# Patient Record
Sex: Male | Born: 1965 | Race: Black or African American | Hispanic: No | Smoking: Smoker, current status unknown
Health system: Southern US, Community
[De-identification: ages and names within clinical notes are randomized; demographics above are authoritative.]

## PROBLEM LIST (undated history)

## (undated) DIAGNOSIS — K635 Polyp of colon: Secondary | ICD-10-CM

## (undated) DIAGNOSIS — E119 Type 2 diabetes mellitus without complications: Secondary | ICD-10-CM

## (undated) DIAGNOSIS — R519 Headache, unspecified: Secondary | ICD-10-CM

## (undated) DIAGNOSIS — N390 Urinary tract infection, site not specified: Secondary | ICD-10-CM

## (undated) HISTORY — PX: WISDOM TOOTH EXTRACTION: SHX21

## (undated) HISTORY — DX: Polyp of colon: K63.5

## (undated) HISTORY — PX: PROSTATE SURGERY: SHX751

## (undated) HISTORY — DX: Type 2 diabetes mellitus without complications: E11.9

## (undated) HISTORY — DX: Urinary tract infection, site not specified: N39.0

## (undated) HISTORY — DX: Headache, unspecified: R51.9

---

## 2015-10-27 LAB — HM COLONOSCOPY

## 2020-11-01 ENCOUNTER — Ambulatory Visit: Payer: BC Managed Care – PPO | Admitting: Internal Medicine

## 2020-11-01 ENCOUNTER — Encounter (INDEPENDENT_AMBULATORY_CARE_PROVIDER_SITE_OTHER): Payer: Self-pay

## 2020-11-01 ENCOUNTER — Other Ambulatory Visit: Payer: Self-pay

## 2020-11-01 ENCOUNTER — Encounter: Payer: Self-pay | Admitting: Internal Medicine

## 2020-11-01 VITALS — BP 126/82 | HR 86 | Temp 98.4°F | Ht 67.0 in | Wt 161.0 lb

## 2020-11-01 DIAGNOSIS — R3916 Straining to void: Secondary | ICD-10-CM | POA: Diagnosis not present

## 2020-11-01 DIAGNOSIS — Z23 Encounter for immunization: Secondary | ICD-10-CM

## 2020-11-01 DIAGNOSIS — Z0001 Encounter for general adult medical examination with abnormal findings: Secondary | ICD-10-CM | POA: Diagnosis not present

## 2020-11-01 DIAGNOSIS — M62542 Muscle wasting and atrophy, not elsewhere classified, left hand: Secondary | ICD-10-CM | POA: Diagnosis not present

## 2020-11-01 DIAGNOSIS — R3121 Asymptomatic microscopic hematuria: Secondary | ICD-10-CM

## 2020-11-01 DIAGNOSIS — N401 Enlarged prostate with lower urinary tract symptoms: Secondary | ICD-10-CM

## 2020-11-01 DIAGNOSIS — E785 Hyperlipidemia, unspecified: Secondary | ICD-10-CM | POA: Diagnosis not present

## 2020-11-01 DIAGNOSIS — Z720 Tobacco use: Secondary | ICD-10-CM | POA: Insufficient documentation

## 2020-11-01 DIAGNOSIS — Z125 Encounter for screening for malignant neoplasm of prostate: Secondary | ICD-10-CM | POA: Diagnosis not present

## 2020-11-01 DIAGNOSIS — K635 Polyp of colon: Secondary | ICD-10-CM | POA: Insufficient documentation

## 2020-11-01 DIAGNOSIS — R972 Elevated prostate specific antigen [PSA]: Secondary | ICD-10-CM

## 2020-11-01 DIAGNOSIS — R7303 Prediabetes: Secondary | ICD-10-CM | POA: Diagnosis not present

## 2020-11-01 LAB — URINALYSIS, ROUTINE W REFLEX MICROSCOPIC
Bilirubin Urine: NEGATIVE
Ketones, ur: NEGATIVE
Leukocytes,Ua: NEGATIVE
Nitrite: NEGATIVE
Specific Gravity, Urine: >=1.03 — AB (ref 1.000–1.030)
Total Protein, Urine: NEGATIVE
Urine Glucose: NEGATIVE
Urobilinogen, UA: 0.2 (ref 0.0–1.0)
pH: 5.5 (ref 5.0–8.0)

## 2020-11-01 LAB — CBC WITH DIFFERENTIAL/PLATELET
Basophils Absolute: 0.1 10*3/uL (ref 0.0–0.1)
Basophils Relative: 1 % (ref 0.0–3.0)
Eosinophils Absolute: 0.1 10*3/uL (ref 0.0–0.7)
Eosinophils Relative: 0.6 % (ref 0.0–5.0)
HCT: 46.3 % (ref 39.0–52.0)
Hemoglobin: 16.5 g/dL (ref 13.0–17.0)
Lymphocytes Relative: 18.8 % (ref 12.0–46.0)
Lymphs Abs: 1.6 10*3/uL (ref 0.7–4.0)
MCHC: 35.6 g/dL (ref 30.0–36.0)
MCV: 84.3 fl (ref 78.0–100.0)
Monocytes Absolute: 0.4 10*3/uL (ref 0.1–1.0)
Monocytes Relative: 5 % (ref 3.0–12.0)
Neutro Abs: 6.5 10*3/uL (ref 1.4–7.7)
Neutrophils Relative %: 74.6 % (ref 43.0–77.0)
Platelets: 280 10*3/uL (ref 150.0–400.0)
RBC: 5.5 Mil/uL (ref 4.22–5.81)
RDW: 14.2 % (ref 11.5–15.5)
WBC: 8.8 10*3/uL (ref 4.0–10.5)

## 2020-11-01 LAB — HEPATIC FUNCTION PANEL
ALT: 22 U/L (ref 0–53)
AST: 23 U/L (ref 0–37)
Albumin: 4.3 g/dL (ref 3.5–5.2)
Alkaline Phosphatase: 71 U/L (ref 39–117)
Bilirubin, Direct: 0.1 mg/dL (ref 0.0–0.3)
Total Bilirubin: 0.4 mg/dL (ref 0.2–1.2)
Total Protein: 6.8 g/dL (ref 6.0–8.3)

## 2020-11-01 LAB — POCT GLYCOSYLATED HEMOGLOBIN (HGB A1C): Hemoglobin A1C: 5.9 % — AB (ref 4.0–5.6)

## 2020-11-01 LAB — BASIC METABOLIC PANEL
BUN: 15 mg/dL (ref 6–23)
CO2: 26 mEq/L (ref 19–32)
Calcium: 9.7 mg/dL (ref 8.4–10.5)
Chloride: 107 mEq/L (ref 96–112)
Creatinine, Ser: 0.89 mg/dL (ref 0.40–1.50)
GFR: 95.68 mL/min (ref >60.00)
Glucose, Bld: 100 mg/dL — ABNORMAL HIGH (ref 70–99)
Potassium: 4.7 mEq/L (ref 3.5–5.1)
Sodium: 139 mEq/L (ref 135–145)

## 2020-11-01 LAB — LIPID PANEL
Cholesterol: 150 mg/dL (ref 0–200)
HDL: 48.4 mg/dL (ref >39.00)
LDL Cholesterol: 91 mg/dL (ref 0–99)
NonHDL: 101.22
Total CHOL/HDL Ratio: 3
Triglycerides: 53 mg/dL (ref 0.0–149.0)
VLDL: 10.6 mg/dL (ref 0.0–40.0)

## 2020-11-01 LAB — PSA: PSA: 9.92 ng/mL — ABNORMAL HIGH (ref 0.10–4.00)

## 2020-11-01 LAB — TSH: TSH: 1.75 u[IU]/mL (ref 0.35–4.50)

## 2020-11-01 NOTE — Progress Notes (Signed)
Subjective:  Patient ID: Jeffery Murray, male    DOB: 1966/01/06  Age: 55 y.o. MRN: [id number]  CC: Annual Exam  This visit occurred during the SARS-CoV-2 public health emergency.  Safety protocols were in place, including screening questions prior to the visit, additional usage of staff PPE, and extensive cleaning of exam room while observing appropriate contact time as indicated for disinfecting solutions.    HPI Jeffery Murray presents for a CPX.  He is s/p TURP for BPH several years ago. He complains of a several week hx of urinary dribbling, straining, and frequency. He is taking metformin for DM2 but complains that it causes diarrhea.  History Jeffery Murray has a past medical history of Colon polyps, Diabetes mellitus without complication (HCC), Frequent headaches, and UTI (urinary tract infection).   He has a past surgical history that includes Prostate surgery and Wisdom tooth extraction.   His family history includes Alcohol abuse in his father; Asthma in his brother; Diabetes in his maternal grandfather; Drug abuse in his brother; Early death in his brother.He reports that he has been smoking. He started smoking about 39 years ago. He has a 38.00 pack-year smoking history. He has never used smokeless tobacco. He reports that he does not drink alcohol and does not use drugs.  Outpatient Medications Prior to Visit  Medication Sig Dispense Refill  . acetaminophen (TYLENOL) 500 MG tablet Take 500 mg by mouth every 6 (six) hours as needed.    . metFORMIN (GLUCOPHAGE) 500 MG tablet Take by mouth daily.    . tamsulosin (FLOMAX) 0.4 MG CAPS capsule Take 0.4 mg by mouth.     No facility-administered medications prior to visit.    ROS Review of Systems  Constitutional: Negative.  Negative for appetite change, diaphoresis, fatigue and unexpected weight change.  HENT: Negative.   Eyes: Negative.   Respiratory: Negative for cough, chest tightness, shortness of breath and wheezing.    Cardiovascular: Negative for chest pain, palpitations and leg swelling.  Gastrointestinal: Positive for diarrhea. Negative for abdominal pain, constipation, nausea and vomiting.  Endocrine: Negative.   Genitourinary: Positive for difficulty urinating and frequency. Negative for decreased urine volume, dysuria, flank pain, genital sores, hematuria, penile discharge, scrotal swelling, testicular pain and urgency.  Musculoskeletal: Positive for neck pain. Negative for arthralgias and myalgias.  Skin: Negative.  Negative for color change and pallor.  Neurological: Positive for weakness. Negative for dizziness, light-headedness, numbness and headaches.  Hematological: Negative for adenopathy. Does not bruise/bleed easily.  Psychiatric/Behavioral: Negative.     Objective:  BP 126/82   Pulse 86   Temp 98.4 F (36.9 C) (Oral)   Ht 5\' 7"  (1.702 m)   Wt 161 lb (73 kg)   SpO2 98%   BMI 25.22 kg/m   Physical Exam Vitals reviewed.  HENT:     Nose: Nose normal.     Mouth/Throat:     Mouth: Mucous membranes are moist.  Eyes:     General: No scleral icterus.    Conjunctiva/sclera: Conjunctivae normal.  Cardiovascular:     Rate and Rhythm: Normal rate and regular rhythm.     Heart sounds: No murmur heard.   Pulmonary:     Effort: Pulmonary effort is normal.     Breath sounds: No stridor. No wheezing, rhonchi or rales.  Abdominal:     General: Abdomen is flat. Bowel sounds are normal. There is no distension.     Palpations: Abdomen is soft. There is no hepatomegaly, splenomegaly or mass.  Tenderness: There is no abdominal tenderness.     Hernia: There is no hernia in the left inguinal area or right inguinal area.  Genitourinary:    Pubic Area: No rash.      Penis: Normal and circumcised. No discharge, swelling or lesions.      Testes: Normal.        Right: Mass, tenderness or swelling not present.        Left: Mass, tenderness or swelling not present.     Epididymis:     Right:  Normal. Not inflamed or enlarged. No mass.     Left: Normal. Not inflamed or enlarged. No mass.     Prostate: Normal. Not enlarged, not tender and no nodules present.     Rectum: Normal. Guaiac result negative. No mass, tenderness, anal fissure, external hemorrhoid or internal hemorrhoid. Normal anal tone.  Musculoskeletal:        General: Normal range of motion.     Cervical back: Neck supple.     Right lower leg: No edema.     Left lower leg: No edema.  Lymphadenopathy:     Cervical: No cervical adenopathy.     Lower Body: No right inguinal adenopathy. No left inguinal adenopathy.  Skin:    General: Skin is warm and dry.     Coloration: Skin is not pale.  Neurological:     General: No focal deficit present.     Mental Status: He is alert and oriented to person, place, and time. Mental status is at baseline.     Cranial Nerves: Cranial nerves are intact.     Sensory: Sensation is intact.     Motor: Weakness and atrophy (L hand) present.     Coordination: Coordination is intact.  Psychiatric:        Mood and Affect: Mood normal.        Behavior: Behavior normal.     Lab Results  Component Value Date   WBC 8.8 11/01/2020   HGB 16.5 11/01/2020   HCT 46.3 11/01/2020   PLT 280.0 11/01/2020   GLUCOSE 100 (H) 11/01/2020   CHOL 150 11/01/2020   TRIG 53.0 11/01/2020   HDL 48.40 11/01/2020   LDLCALC 91 11/01/2020   ALT 22 11/01/2020   AST 23 11/01/2020   NA 139 11/01/2020   K 4.7 11/01/2020   CL 107 11/01/2020   CREATININE 0.89 11/01/2020   BUN 15 11/01/2020   CO2 26 11/01/2020   TSH 1.75 11/01/2020   PSA 9.92 (H) 11/01/2020   HGBA1C 5.9 (A) 11/01/2020    Assessment & Plan:   Jeffery Murray was seen today for annual exam.  Diagnoses and all orders for this visit:  Benign prostatic hyperplasia (BPH) with straining on urination- He is s/p TURP and has recurrent sx's. Will treat with an alpha-blocker and 5 alpha reductase inhibitor. -     CBC with Differential/Platelet;  Future -     Urinalysis, Routine w reflex microscopic; Future -     Urinalysis, Routine w reflex microscopic -     CBC with Differential/Platelet -     tamsulosin (FLOMAX) 0.4 MG CAPS capsule; Take 2 capsules (0.8 mg total) by mouth daily after supper.  Prediabetes- His A1c is at 5.9% and he complains of diarrhea.  Will discontinue Metformin. -     CBC with Differential/Platelet; Future -     Basic metabolic panel; Future -     C-peptide; Future -     Anti-islet cell antibody -  POCT glycosylated hemoglobin (Hb A1C) -     C-peptide -     Basic metabolic panel -     CBC with Differential/Platelet  Encounter for general adult medical examination with abnormal findings- Exam completed, labs reviewed, vaccines reviewed and updated, cancer screenings are up-to-date, patient education material was given. -     Lipid panel; Future -     PSA; Future -     HIV Antibody (routine testing w rflx); Future -     Hepatitis C antibody; Future -     Hepatitis C antibody -     HIV Antibody (routine testing w rflx) -     PSA -     Lipid panel  Hyperlipidemia with target LDL less than 130- He does not have an elevated ASCVD risk score so I did not recommend a statin for CV risk reduction. -     Hepatic function panel; Future -     TSH; Future -     TSH -     Hepatic function panel  Atrophy of muscle of left hand -     Ambulatory referral to Neurology  Tobacco abuse -     Ambulatory Referral for Lung Cancer Scre  Polyp of colon, unspecified part of colon, unspecified type -     Ambulatory referral to Gastroenterology  PSA elevation -     Ambulatory referral to Urology  Asymptomatic microscopic hematuria-I have asked him to come back to screen for infection.  He smokes cigarettes and is therefore high risk for bladder and renal cell carcinoma.  I have asked him to see urology. -     CULTURE, URINE COMPREHENSIVE; Future -     Chlamydia/Gonococcus/Trichomonas, NAA; Future -     Ambulatory  referral to Urology  Other orders -     Flu Vaccine QUAD 6+ mos PF IM (Fluarix Quad PF)   I have discontinued Ashton Scadden's metFORMIN. I have also changed his tamsulosin. Additionally, I am having him start on dutasteride. Lastly, I am having him maintain his acetaminophen.  Meds ordered this encounter  Medications  . tamsulosin (FLOMAX) 0.4 MG CAPS capsule    Sig: Take 2 capsules (0.8 mg total) by mouth daily after supper.    Dispense:  90 capsule    Refill:  1  . dutasteride (AVODART) 0.5 MG capsule    Sig: Take 1 capsule (0.5 mg total) by mouth daily.    Dispense:  90 capsule    Refill:  1     Follow-up: Return in about 6 months (around 05/01/2021).  Sanda Linger, MD

## 2020-11-01 NOTE — Patient Instructions (Signed)

## 2020-11-02 ENCOUNTER — Encounter: Payer: Self-pay | Admitting: Internal Medicine

## 2020-11-02 LAB — HEPATITIS C ANTIBODY
Hepatitis C Ab: NONREACTIVE
SIGNAL TO CUT-OFF: 0.04 (ref <1.00)

## 2020-11-02 LAB — HIV ANTIBODY (ROUTINE TESTING W REFLEX): HIV 1&2 Ab, 4th Generation: NONREACTIVE

## 2020-11-02 LAB — ANTI-ISLET CELL ANTIBODY: Islet Cell Ab: NEGATIVE

## 2020-11-02 LAB — C-PEPTIDE: C-Peptide: 1.09 ng/mL (ref 0.80–3.85)

## 2020-11-04 ENCOUNTER — Encounter: Payer: Self-pay | Admitting: Internal Medicine

## 2020-11-04 MED ORDER — DUTASTERIDE 0.5 MG PO CAPS: 0.5000 mg | ORAL_CAPSULE | Freq: Every day | ORAL | 1 refills | Status: DC

## 2020-11-04 MED ORDER — TAMSULOSIN HCL 0.4 MG PO CAPS
0.8000 mg | ORAL_CAPSULE | Freq: Every day | ORAL | 1 refills | Status: DC
Start: 2020-11-04 — End: 2020-12-15

## 2020-11-15 ENCOUNTER — Other Ambulatory Visit: Payer: Self-pay | Admitting: *Deleted

## 2020-11-15 DIAGNOSIS — F17210 Nicotine dependence, cigarettes, uncomplicated: Secondary | ICD-10-CM

## 2020-11-29 ENCOUNTER — Telehealth: Payer: Self-pay | Admitting: Internal Medicine

## 2020-11-29 NOTE — Telephone Encounter (Signed)
   Patient is requesting a call back in regards to recent lab results  

## 2020-11-29 NOTE — Telephone Encounter (Signed)
Called pt, LVM.   

## 2020-11-30 NOTE — Telephone Encounter (Signed)
Patient returning call, would like a call back.

## 2020-12-01 NOTE — Telephone Encounter (Signed)
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 I have double booked him for 3/9 during the time he says he will be back in town.

## 2020-12-13 ENCOUNTER — Encounter: Payer: Self-pay | Admitting: Acute Care

## 2020-12-13 ENCOUNTER — Ambulatory Visit
Admission: RE | Admit: 2020-12-13 | Discharge: 2020-12-13 | Disposition: A | Discharge status: Home / Self Care | Payer: BC Managed Care – PPO | Source: Ambulatory Visit | Attending: Acute Care | Admitting: Acute Care

## 2020-12-13 ENCOUNTER — Other Ambulatory Visit: Payer: Self-pay

## 2020-12-13 ENCOUNTER — Ambulatory Visit (INDEPENDENT_AMBULATORY_CARE_PROVIDER_SITE_OTHER): Payer: BC Managed Care – PPO | Admitting: Acute Care

## 2020-12-13 VITALS — BP 122/72 | HR 95 | Temp 97.4°F | Ht 67.0 in | Wt 164.0 lb

## 2020-12-13 DIAGNOSIS — Z122 Encounter for screening for malignant neoplasm of respiratory organs: Secondary | ICD-10-CM

## 2020-12-13 DIAGNOSIS — F17210 Nicotine dependence, cigarettes, uncomplicated: Secondary | ICD-10-CM

## 2020-12-13 NOTE — Progress Notes (Signed)
Shared Decision Making Visit Lung Cancer Screening Program [id number])   Eligibility:  Age 55 y.o.  Pack Years Smoking History Calculation 39 pack year smoking history (# packs/per year x # years smoked)  Recent History of coughing up blood  no  Unexplained weight loss? no ( >Than 15 pounds within the last 6 months )  Prior History Lung / other cancer no (Diagnosis within the last 5 years already requiring surveillance chest CT Scans).  Smoking Status Current Smoker  Former Smokers: Years since quit: NA  Quit Date: NA  Visit Components:  Discussion included one or more decision making aids. yes  Discussion included risk/benefits of screening. yes  Discussion included potential follow up diagnostic testing for abnormal scans. yes  Discussion included meaning and risk of over diagnosis. yes  Discussion included meaning and risk of False Positives. yes  Discussion included meaning of total radiation exposure. yes  Counseling Included:  Importance of adherence to annual lung cancer LDCT screening. yes  Impact of comorbidities on ability to participate in the program. yes  Ability and willingness to under diagnostic treatment. yes  Smoking Cessation Counseling:  Current Smokers:   Discussed importance of smoking cessation. yes  Information about tobacco cessation classes and interventions provided to patient. yes  Patient provided with "ticket" for LDCT Scan. yes  Symptomatic Patient. no  Counseling NA  Diagnosis Code: Tobacco Use Z72.0  Asymptomatic Patient yes  Counseling (Intermediate counseling: > three minutes counseling) G4010  Former Smokers:   Discussed the importance of maintaining cigarette abstinence. yes  Diagnosis Code: Personal History of Nicotine Dependence. U72.536  Information about tobacco cessation classes and interventions provided to patient. Yes  Patient provided with "ticket" for LDCT Scan. yes  Written Order for Lung Cancer  Screening with LDCT placed in Epic. Yes (CT Chest Lung Cancer Screening Low Dose W/O CM) [id number] Z12.2-Screening of respiratory organs Z87.891-Personal history of nicotine dependence  I have spent 25 minutes of face to face time with Mr/  discussing the risks and benefits of lung cancer screening. We viewed a power point together that explained in detail the above noted topics. We paused at intervals to allow for questions to be asked and answered to ensure understanding.We discussed that the single most powerful action that he can take to decrease his risk of developing lung cancer is to quit smoking. We discussed whether or not he is ready to commit to setting a quit date. We discussed options for tools to aid in quitting smoking including nicotine replacement therapy, non-nicotine medications, support groups, Quit Smart classes, and behavior modification. We discussed that often times setting smaller, more achievable goals, such as eliminating 1 cigarette a day for a week and then 2 cigarettes a day for a week can be helpful in slowly decreasing the number of cigarettes smoked. This allows for a sense of accomplishment as well as providing a clinical benefit. I gave him the " Be Stronger Than Your Excuses" card with contact information for community resources, classes, free nicotine replacement therapy, and access to mobile apps, text messaging, and on-line smoking cessation help. I have also given him my card and contact information in the event he needs to contact me. We discussed the time and location of the scan, and that either Abigail Miyamoto RN or I will call with the results within 24-48 hours of receiving them. I have offered him  a copy of the power point we viewed  as a resource in the event they need reinforcement  of the concepts we discussed today in the office. The patient verbalized understanding of all of  the above and had no further questions upon leaving the office. They have my contact  information in the event they have any further questions.  I spent 3 minutes counseling on smoking cessation and the health risks of continued tobacco abuse.  I explained to the patient that there has been a high incidence of coronary artery disease noted on these exams. I explained that this is a non-gated exam therefore degree or severity cannot be determined. This patient is not currently on statin therapy. I have asked the patient to follow-up with their PCP regarding any incidental finding of coronary artery disease and management with diet or medication as their PCP  feels is clinically indicated. The patient verbalized understanding of the above and had no further questions upon completion of the visit.      Bevelyn Ngo, NP 12/13/2020

## 2020-12-13 NOTE — Patient Instructions (Signed)
Thank you for participating in the [facility]. It was our pleasure to meet you today. We will call you with the results of your scan within the next few days. Your scan will be assigned a Lung RADS category score by the physicians reading the scans.  This Lung RADS score determines follow up scanning.  See below for description of categories, and follow up screening recommendations. We will be in touch to schedule your follow up screening annually or based on recommendations of our providers. We will fax a copy of your scan results to your Primary Care Physician, or the physician who referred you to the program, to ensure they have the results. Please call the office if you have any questions or concerns regarding your scanning experience or results.  Our office number is [phone]. Please speak with Denise Phelps, RN. She is our Lung Cancer Screening RN. If she is unavailable when you call, please have the office staff send her a message. She will return your call at her earliest convenience. Remember, if your scan is normal, we will scan you annually as long as you continue to meet the criteria for the program. (Age 57-77, Current smoker or smoker who has quit within the last 15 years). If you are a smoker, remember, quitting is the single most powerful action that you can take to decrease your risk of lung cancer and other pulmonary, breathing related problems. We know quitting is hard, and we are here to help.  Please let us know if there is anything we can do to help you meet your goal of quitting. If you are a former smoker, congratulations. We are proud of you! Remain smoke free! Remember you can refer friends or family members through the number above.  We will screen them to make sure they meet criteria for the program. Thank you for helping us take better care of you by participating in Lung Screening.  Lung RADS Categories:  Lung RADS 1: no nodules  or definitely non-concerning nodules.  Recommendation is for a repeat annual scan in 12 months.  Lung RADS 2:  nodules that are non-concerning in appearance and behavior with a very low likelihood of becoming an active cancer. Recommendation is for a repeat annual scan in 12 months.  Lung RADS 3: nodules that are probably non-concerning , includes nodules with a low likelihood of becoming an active cancer.  Recommendation is for a 6-month repeat screening scan. Often noted after an upper respiratory illness. We will be in touch to make sure you have no questions, and to schedule your 6-month scan.  Lung RADS 4 A: nodules with concerning findings, recommendation is most often for a follow up scan in 3 months or additional testing based on our provider's assessment of the scan. We will be in touch to make sure you have no questions and to schedule the recommended 3 month follow up scan.  Lung RADS 4 B:  indicates findings that are concerning. We will be in touch with you to schedule additional diagnostic testing based on our provider's  assessment of the scan.   

## 2020-12-15 ENCOUNTER — Other Ambulatory Visit: Payer: Self-pay

## 2020-12-15 ENCOUNTER — Encounter: Payer: Self-pay | Admitting: Internal Medicine

## 2020-12-15 ENCOUNTER — Ambulatory Visit: Payer: BC Managed Care – PPO | Admitting: Internal Medicine

## 2020-12-15 DIAGNOSIS — N401 Enlarged prostate with lower urinary tract symptoms: Secondary | ICD-10-CM | POA: Diagnosis not present

## 2020-12-15 DIAGNOSIS — R3916 Straining to void: Secondary | ICD-10-CM

## 2020-12-15 MED ORDER — DUTASTERIDE 0.5 MG PO CAPS
0.5000 mg | ORAL_CAPSULE | Freq: Every day | ORAL | 1 refills | Status: AC
Start: 2020-12-15 — End: ?

## 2020-12-15 MED ORDER — TAMSULOSIN HCL 0.4 MG PO CAPS: 0.8000 mg | ORAL_CAPSULE | Freq: Every day | ORAL | 1 refills | Status: DC

## 2020-12-15 NOTE — Progress Notes (Signed)
Subjective:  Patient ID: Jeffery Murray, male    DOB: 1966-04-06  Age: 55 y.o. MRN: [id number]  CC: Benign Prostatic Hypertrophy  This visit occurred during the SARS-CoV-2 public health emergency.  Safety protocols were in place, including screening questions prior to the visit, additional usage of staff PPE, and extensive cleaning of exam room while observing appropriate contact time as indicated for disinfecting solutions.    HPI Jeffery Murray presents for f/up - His sx's have improved since starting flomax and avodart. He sees urology this week.  Outpatient Medications Prior to Visit  Medication Sig Dispense Refill  . acetaminophen (TYLENOL) 500 MG tablet Take 500 mg by mouth every 6 (six) hours as needed.    . dutasteride (AVODART) 0.5 MG capsule Take 1 capsule (0.5 mg total) by mouth daily. 90 capsule 1  . tamsulosin (FLOMAX) 0.4 MG CAPS capsule Take 2 capsules (0.8 mg total) by mouth daily after supper. 90 capsule 1   No facility-administered medications prior to visit.    ROS Review of Systems  Constitutional: Negative for chills, fatigue and fever.  HENT: Negative.   Eyes: Negative.   Respiratory: Negative for cough, chest tightness and wheezing.   Cardiovascular: Negative for chest pain, palpitations and leg swelling.  Gastrointestinal: Negative for abdominal pain and diarrhea.  Endocrine: Negative.   Genitourinary: Positive for difficulty urinating. Negative for dysuria and hematuria.  Musculoskeletal: Negative.   Skin: Negative.   Neurological: Negative.   Hematological: Negative for adenopathy. Does not bruise/bleed easily.  Psychiatric/Behavioral: Negative.     Objective:  BP 126/84   Pulse 89   Temp 98.1 F (36.7 C) (Oral)   Ht 5\' 7"  (1.702 m)   Wt 162 lb (73.5 kg)   SpO2 96%   BMI 25.37 kg/m   BP Readings from Last 3 Encounters:  12/15/20 126/84  12/13/20 122/72  11/01/20 126/82    Wt Readings from Last 3 Encounters:  12/15/20 162 lb (73.5  kg)  12/13/20 164 lb (74.4 kg)  11/01/20 161 lb (73 kg)    Physical Exam Vitals reviewed.  HENT:     Nose: Nose normal.     Mouth/Throat:     Mouth: Mucous membranes are moist.  Eyes:     Conjunctiva/sclera: Conjunctivae normal.  Cardiovascular:     Rate and Rhythm: Normal rate and regular rhythm.     Heart sounds: No murmur heard.   Pulmonary:     Effort: Pulmonary effort is normal.     Breath sounds: No stridor. No wheezing, rhonchi or rales.  Abdominal:     General: Abdomen is flat. Bowel sounds are normal. There is no distension.     Palpations: Abdomen is soft. There is no hepatomegaly.     Tenderness: There is no abdominal tenderness.  Musculoskeletal:        General: Normal range of motion.     Cervical back: Normal range of motion.     Right lower leg: No edema.     Left lower leg: No edema.  Skin:    General: Skin is warm and dry.  Neurological:     General: No focal deficit present.     Mental Status: He is alert.     Lab Results  Component Value Date   WBC 8.8 11/01/2020   HGB 16.5 11/01/2020   HCT 46.3 11/01/2020   PLT 280.0 11/01/2020   GLUCOSE 100 (H) 11/01/2020   CHOL 150 11/01/2020   TRIG 53.0 11/01/2020   HDL 48.40  11/01/2020   LDLCALC 91 11/01/2020   ALT 22 11/01/2020   AST 23 11/01/2020   NA 139 11/01/2020   K 4.7 11/01/2020   CL 107 11/01/2020   CREATININE 0.89 11/01/2020   BUN 15 11/01/2020   CO2 26 11/01/2020   TSH 1.75 11/01/2020   PSA 9.92 (H) 11/01/2020   HGBA1C 5.9 (A) 11/01/2020    CT CHEST LUNG CA SCREEN LOW DOSE W/O CM  Result Date: 12/14/2020 CLINICAL DATA:  55 year old male current smoker with 39 pack-year history of smoking. Lung cancer screening examination. EXAM: CT CHEST WITHOUT CONTRAST LOW-DOSE FOR LUNG CANCER SCREENING TECHNIQUE: Multidetector CT imaging of the chest was performed following the standard protocol without IV contrast. COMPARISON:  No priors. FINDINGS: Cardiovascular: Heart size is normal. There is no  significant pericardial fluid, thickening or pericardial calcification. There is aortic atherosclerosis, as well as atherosclerosis of the great vessels of the mediastinum and the coronary arteries, including calcified atherosclerotic plaque in the left anterior descending and left circumflex coronary arteries. Mediastinum/Nodes: No pathologically enlarged mediastinal or hilar lymph nodes. Please note that accurate exclusion of hilar adenopathy is limited on noncontrast CT scans. Esophagus is unremarkable in appearance. No axillary lymphadenopathy. Lungs/Pleura: Multiple tiny calcified granulomas are noted in the lungs. No other suspicious appearing pulmonary nodules or masses are noted. No acute consolidative airspace disease. No pleural effusions. Mild diffuse bronchial wall thickening with mild centrilobular and paraseptal emphysema. Upper Abdomen: Aortic atherosclerosis. Musculoskeletal: There are no aggressive appearing lytic or blastic lesions noted in the visualized portions of the skeleton. IMPRESSION: 1. Lung-RADS 1S, negative. Continue annual screening with low-dose chest CT without contrast in 12 months. 2. The "S" modifier above refers to potentially clinically significant non lung cancer related findings. Specifically, there is aortic atherosclerosis, in addition to 2 vessel coronary artery disease. Please note that although the presence of coronary artery calcium documents the presence of coronary artery disease, the severity of this disease and any potential stenosis cannot be assessed on this non-gated CT examination. Assessment for potential risk factor modification, dietary therapy or pharmacologic therapy may be warranted, if clinically indicated. 3. Mild diffuse bronchial wall thickening with mild centrilobular and paraseptal emphysema; imaging findings suggestive of underlying COPD. Aortic Atherosclerosis (ICD10-I70.0) and Emphysema (ICD10-J43.9). Electronically Signed   By: Trudie Reed  M.D.   On: 12/14/2020 08:45    Assessment & Plan:   Jeffery Murray was seen today for benign prostatic hypertrophy.  Diagnoses and all orders for this visit:  Benign prostatic hyperplasia (BPH) with straining on urination -     dutasteride (AVODART) 0.5 MG capsule; Take 1 capsule (0.5 mg total) by mouth daily. -     tamsulosin (FLOMAX) 0.4 MG CAPS capsule; Take 2 capsules (0.8 mg total) by mouth daily after supper.   I am having Jeffery Murray maintain his acetaminophen, dutasteride, and tamsulosin.  Meds ordered this encounter  Medications  . dutasteride (AVODART) 0.5 MG capsule    Sig: Take 1 capsule (0.5 mg total) by mouth daily.    Dispense:  90 capsule    Refill:  1  . tamsulosin (FLOMAX) 0.4 MG CAPS capsule    Sig: Take 2 capsules (0.8 mg total) by mouth daily after supper.    Dispense:  90 capsule    Refill:  1     Follow-up: No follow-ups on file.  Sanda Linger, MD

## 2020-12-16 ENCOUNTER — Encounter: Payer: Self-pay | Admitting: Internal Medicine

## 2020-12-16 NOTE — Patient Instructions (Signed)

## 2020-12-17 NOTE — Progress Notes (Signed)
Please call patient and let them  know their  low dose Ct was read as a Lung RADS 1, negative study: no nodules or definitely benign nodules. Radiology recommendation is for a repeat LDCT in 12 months. .Please let them  know we will order and schedule their  annual screening scan for 12/2021. Please let them  know there was notation of CAD on their  scan.  Please remind the patient  that this is a non-gated exam therefore degree or severity of disease  cannot be determined. Please have them  follow up with their PCP regarding potential risk factor modification, dietary therapy or pharmacologic therapy if clinically indicated. Pt.  is not  currently on statin therapy. Please place order for annual  screening scan for  12/2021 and fax results to PCP. Thanks so much.  Angelique Blonder, this patient is not on statin meds and he has not been seen by Cards through the [facility] system. He has  aortic atherosclerosis, in addition to 2 vessel coronary artery disease. Please have him speak to his PCP about a cards consult. Thanks so much

## 2020-12-20 ENCOUNTER — Other Ambulatory Visit: Payer: Self-pay | Admitting: *Deleted

## 2020-12-20 DIAGNOSIS — F17210 Nicotine dependence, cigarettes, uncomplicated: Secondary | ICD-10-CM

## 2021-01-03 ENCOUNTER — Telehealth: Payer: Self-pay | Admitting: Internal Medicine

## 2021-01-03 NOTE — Telephone Encounter (Signed)
Noted  

## 2021-01-03 NOTE — Telephone Encounter (Signed)
[facility] Report/Call: 12/31/20 ---Caller states he has blood in his urine. Started last night. He is having burning and urgency. This happened once before a few months ago. (He had prostate sx) He has some back/flank in left side which comes and goes.  Advised see PCP within 4 hours. Patient is in [place] currently. Advised he go to UC. He will do so.

## 2021-01-11 LAB — HM COLONOSCOPY

## 2021-01-17 ENCOUNTER — Ambulatory Visit: Payer: BC Managed Care – PPO | Admitting: Neurology

## 2021-04-01 ENCOUNTER — Ambulatory Visit: Payer: BC Managed Care – PPO | Admitting: Diagnostic Neuroimaging

## 2021-04-05 ENCOUNTER — Other Ambulatory Visit: Payer: Self-pay | Admitting: Internal Medicine

## 2021-04-05 DIAGNOSIS — R3916 Straining to void: Secondary | ICD-10-CM

## 2021-04-26 ENCOUNTER — Ambulatory Visit: Payer: BC Managed Care – PPO | Admitting: Neurology

## 2021-04-26 ENCOUNTER — Encounter: Payer: Self-pay | Admitting: Neurology

## 2021-04-26 VITALS — BP 131/82 | HR 82 | Ht 67.0 in | Wt 164.0 lb

## 2021-04-26 DIAGNOSIS — G5622 Lesion of ulnar nerve, left upper limb: Secondary | ICD-10-CM

## 2021-04-26 NOTE — Progress Notes (Signed)
Reason for visit: Left hand weakness  Referring physician: Dr. Stefano Gaul is a 55 y.o. male  History of present illness:  Jeffery Murray is a 55 year old right-handed black male with a history of some weakness of the left hand that has been present for a year and a half or 2 years.  The patient has noted some numbness of the fourth and fifth fingers of the left hand, he has inability to fully straighten out the fourth and fifth fingers.  The patient denies any neck pain or pain down the arm.  He has had some minimal numbness on the right hand at times.  He may drop things out of the left hand.  He denies any numbness in the feet.  He denies any weakness of the legs.  He has not had any difficulty controlling the bowels, he has had prostate surgery previously and does have some urinary urgency at times.  He has not noted any severe changes in balance.  He has a history of borderline diabetes, he recently was taken off of his metformin as his blood sugars have come under good control.  Past Medical History:  Diagnosis Date   Colon polyps    Diabetes mellitus without complication (HCC)    Frequent headaches    UTI (urinary tract infection)     Past Surgical History:  Procedure Laterality Date   PROSTATE SURGERY     WISDOM TOOTH EXTRACTION      Family History  Problem Relation Age of Onset   Alcohol abuse Father    Migraines Sister    Asthma Brother    Early death Brother    Drug abuse Brother    Diabetes Maternal Grandfather     Social history:  reports that he has been smoking. He started smoking about 39 years ago. He has a 38.00 pack-year smoking history. He has never used smokeless tobacco. He reports that he does not drink alcohol and does not use drugs.  Medications:  Prior to Admission medications   Medication Sig Start Date End Date Taking? Authorizing Provider  acetaminophen (TYLENOL) 500 MG tablet Take 500 mg by mouth every 6 (six) hours as needed.   Yes  [provider]  dutasteride (AVODART) 0.5 MG capsule Take 1 capsule (0.5 mg total) by mouth daily. 12/15/20  Yes Etta Grandchild, MD  tamsulosin (FLOMAX) 0.4 MG CAPS capsule Take 2 capsules (0.8 mg total) by mouth daily after supper. 12/15/20  Yes Etta Grandchild, MD      Allergies  Allergen Reactions   Metformin And Related Diarrhea    ROS:  Out of a complete 14 system review of symptoms, the patient complains only of the following symptoms, and all other reviewed systems are negative.  Left hand numbness and weakness  Blood pressure 131/82, pulse 82, height 5\' 7"  (1.702 m), weight 164 lb (74.4 kg).  Physical Exam  General: The patient is alert and cooperative at the time of the examination.  Eyes: Pupils are equal, round, and reactive to light. Discs are flat bilaterally.  Neck: The neck is supple, no carotid bruits are noted.  Respiratory: The respiratory examination is clear.  Cardiovascular: The cardiovascular examination reveals a regular rate and rhythm, no obvious murmurs or rubs are noted.  Skin: Extremities are without significant edema.  Atrophy of the first dorsal interosseous muscle on the left hand is noted.  Neurologic Exam  Mental status: The patient is alert and oriented x 3 at  the time of the examination. The patient has apparent normal recent and remote memory, with an apparently normal attention span and concentration ability.  Cranial nerves: Facial symmetry is present. There is good sensation of the face to pinprick and soft touch bilaterally. The strength of the facial muscles and the muscles to head turning and shoulder shrug are normal bilaterally. Speech is well enunciated, no aphasia or dysarthria is noted. Extraocular movements are full. Visual fields are full. The tongue is midline, and the patient has symmetric elevation of the soft palate. No obvious hearing deficits are noted.  Motor: The motor testing reveals 5 over 5 strength of all 4  extremities, with exception of some weakness of intrinsic muscles of the left hand.  It appears that the distal flexors of the fingers of the left hand are of normal strength. Good symmetric motor tone is noted throughout.  Sensory: Sensory testing is intact to pinprick, soft touch, vibration sensation, and position sense on all 4 extremities, with exception some decreased pinprick sensation on the left fifth finger. No evidence of extinction is noted.  Coordination: Cerebellar testing reveals good finger-nose-finger and heel-to-shin bilaterally.  Gait and station: Gait is normal. Tandem gait is normal. Romberg is negative. No drift is seen.  Reflexes: Deep tendon reflexes are symmetric and normal bilaterally.  Ankle jerk reflexes are maintained bilaterally.  Toes are downgoing bilaterally.   Assessment/Plan:  1.  Left ulnar neuropathy, distal  The patient appears to have good strength of the distal flexors of the fourth and fifth fingers of the left hand suggesting that the ulnar neuropathy may be more distal.  The patient will be set up for nerve conduction studies on both arms, EMG on the left arm.  He will follow-up for the above study.  Jeffery Palau MD 04/26/2021 11:21 AM  [facility] [address] [place], [place] [address]  Phone [phone] Fax [phone]

## 2021-05-07 IMAGING — CT CT CHEST LUNG CANCER SCREENING LOW DOSE W/O CM
1 of 2 series · 14 of 32 positions shown, 18 images · non-contrast
Comparison: No priors.

CLINICAL DATA: 54-year-old male current smoker with 39 pack-year
history of smoking. Lung cancer screening examination.

EXAM:
CT CHEST WITHOUT CONTRAST LOW-DOSE FOR LUNG CANCER SCREENING
TECHNIQUE: Multidetector CT imaging of the chest was performed following the
standard protocol without IV contrast.

[Series 3: ldct screen lung · axial · 0.71mm/px · z∈[-301,-1]mm · 14 of 330 slices shown, 18 images]
[im 15/330  mediastinal]
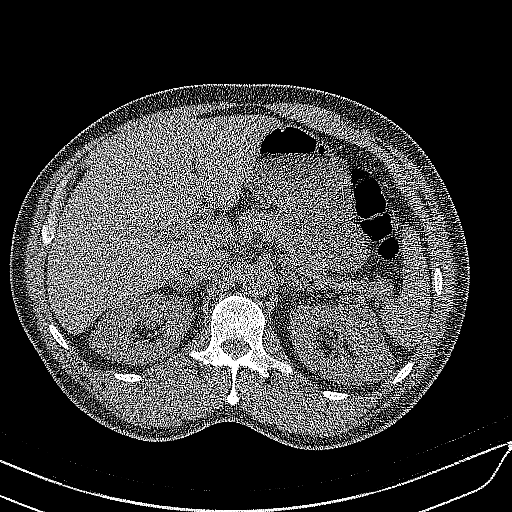
[im 15/330  lung]
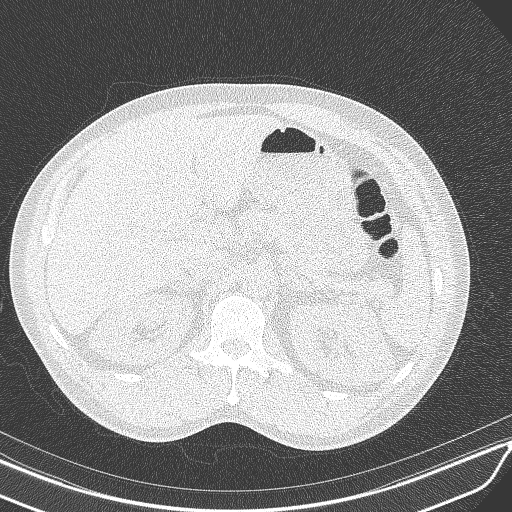
[im 45/330  lung]
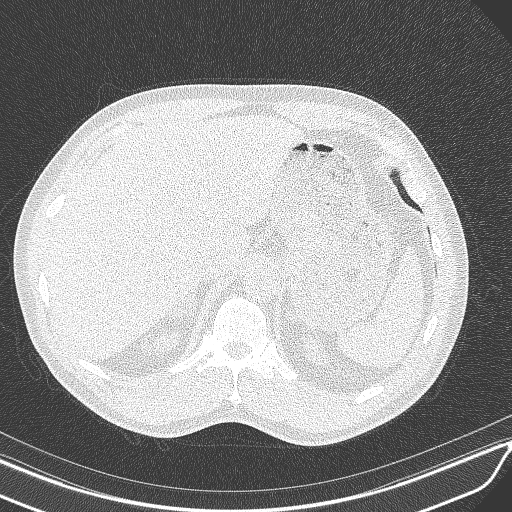
[im 75/330  lung]
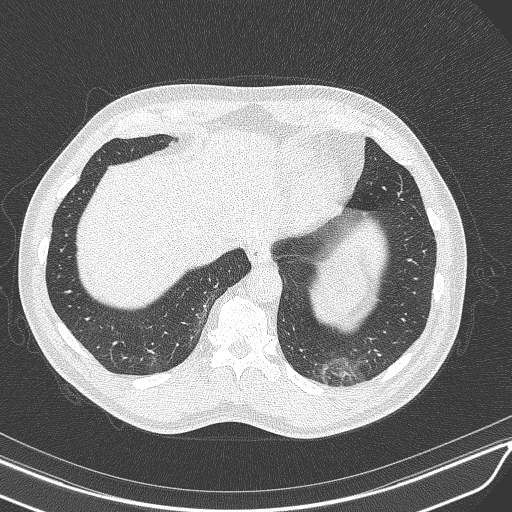
[im 90/330  lung]
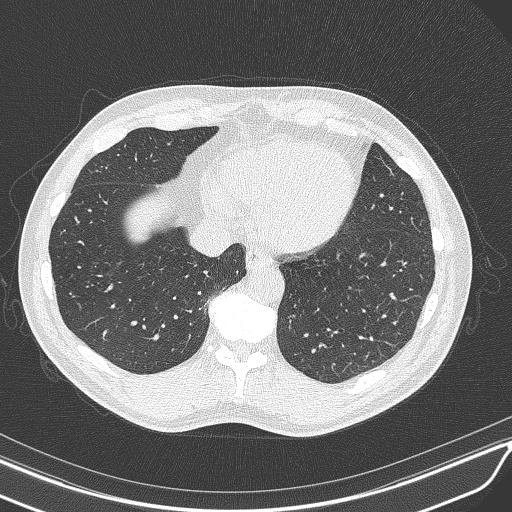
[im 105/330  mediastinal]
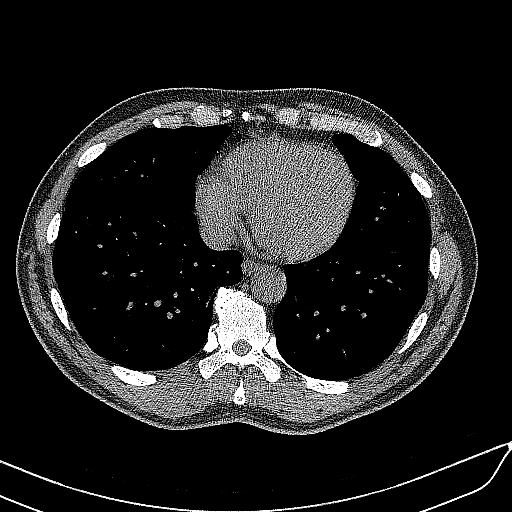
[im 105/330  lung]
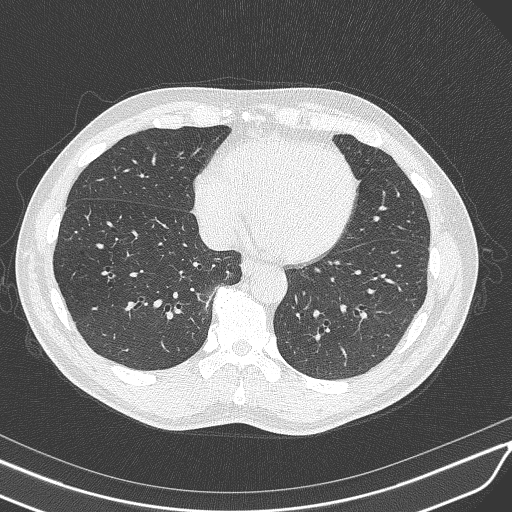
[im 135/330  lung]
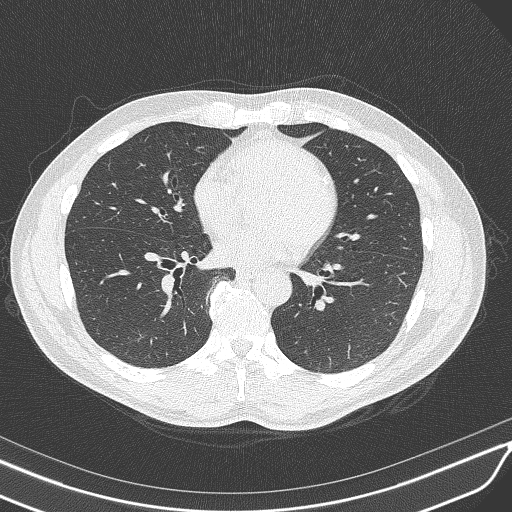
[im 156/330  lung]
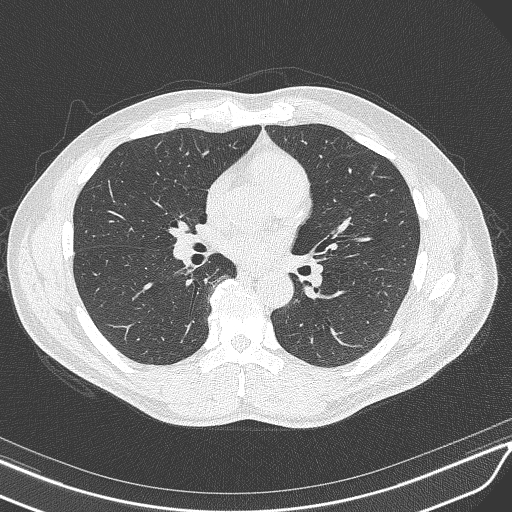
[im 165/330  lung]
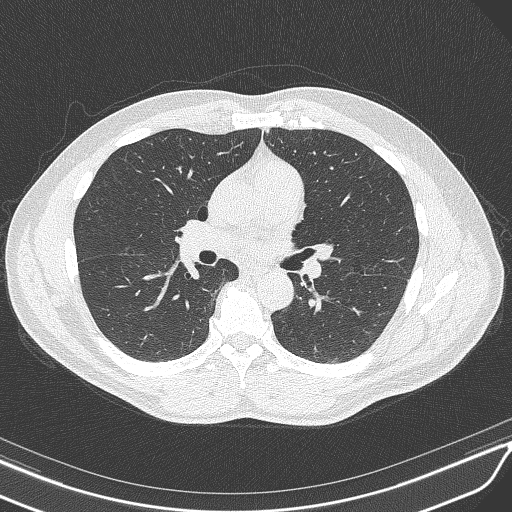
[im 195/330  mediastinal]
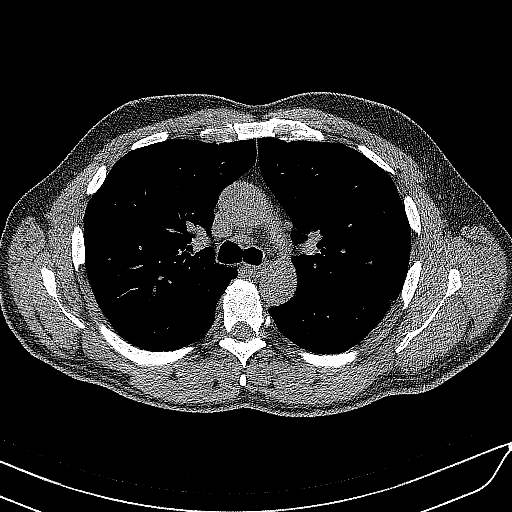
[im 195/330  lung]
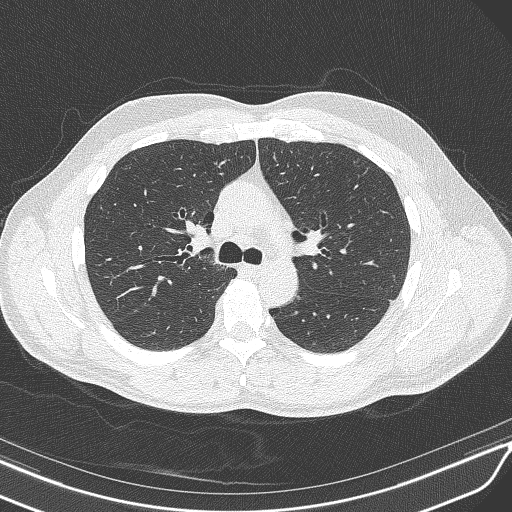
[im 225/330  lung]
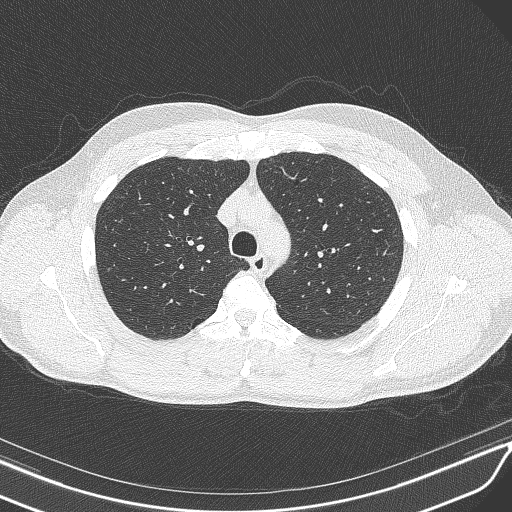
[im 247/330  lung]
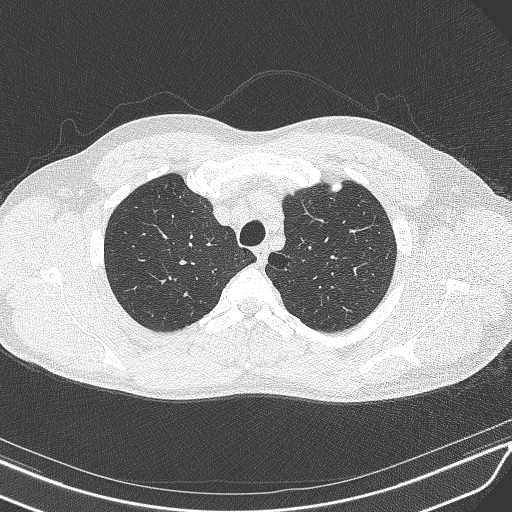
[im 255/330  lung]
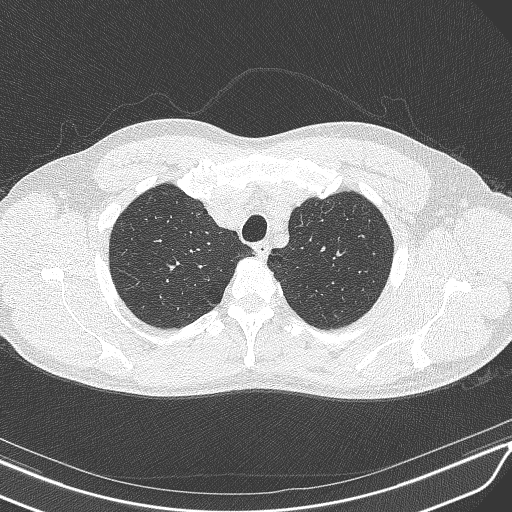
[im 285/330  mediastinal]
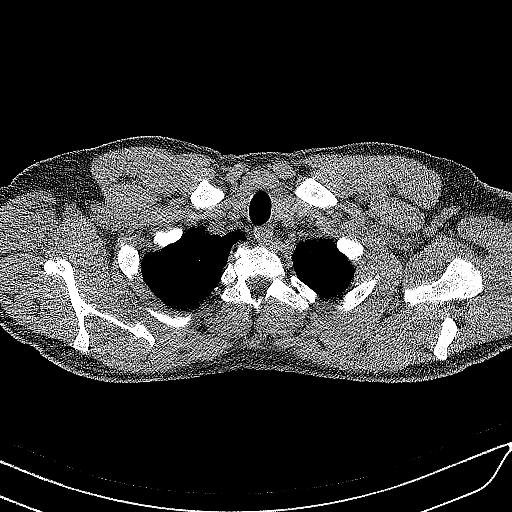
[im 285/330  lung]
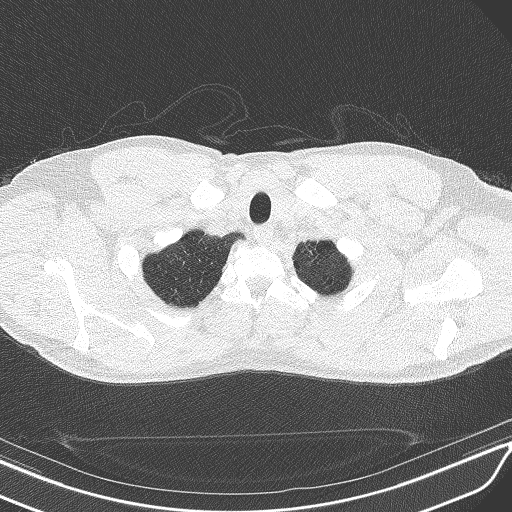
[im 315/330  lung]
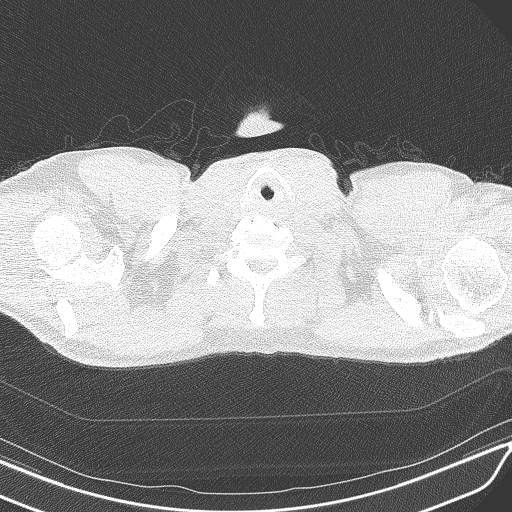

[14 of 32 positions shown; findings below may reference images not displayed]

FINDINGS: Cardiovascular: Heart size is normal. There is no significant
pericardial fluid, thickening or pericardial calcification. There is
aortic atherosclerosis, as well as atherosclerosis of the great
vessels of the mediastinum and the coronary arteries, including
calcified atherosclerotic plaque in the left anterior descending and
left circumflex coronary arteries.

Mediastinum/Nodes: No pathologically enlarged mediastinal or hilar
lymph nodes. Please note that accurate exclusion of hilar adenopathy
is limited on noncontrast CT scans. Esophagus is unremarkable in
appearance. No axillary lymphadenopathy.

Lungs/Pleura: Multiple tiny calcified granulomas are noted in the
lungs. No other suspicious appearing pulmonary nodules or masses are
noted. No acute consolidative airspace disease. No pleural
effusions. Mild diffuse bronchial wall thickening with mild
centrilobular and paraseptal emphysema.

Upper Abdomen: Aortic atherosclerosis.

Musculoskeletal: There are no aggressive appearing lytic or blastic
lesions noted in the visualized portions of the skeleton.
IMPRESSION: 1. Lung-RADS 1S, negative. Continue annual screening with low-dose
chest CT without contrast in 12 months.
2. The "S" modifier above refers to potentially clinically
significant non lung cancer related findings. Specifically, there is
aortic atherosclerosis, in addition to 2 vessel coronary artery
disease. Please note that although the presence of coronary artery
calcium documents the presence of coronary artery disease, the
severity of this disease and any potential stenosis cannot be
assessed on this non-gated CT examination. Assessment for potential
risk factor modification, dietary therapy or pharmacologic therapy
may be warranted, if clinically indicated.
3. Mild diffuse bronchial wall thickening with mild centrilobular
and paraseptal emphysema; imaging findings suggestive of underlying
COPD.

Aortic Atherosclerosis (LU2YP-818.8) and Emphysema (LU2YP-ASU.P).

## 2021-05-16 ENCOUNTER — Ambulatory Visit: Payer: BC Managed Care – PPO | Admitting: Diagnostic Neuroimaging

## 2021-05-18 ENCOUNTER — Other Ambulatory Visit: Payer: Self-pay | Admitting: Internal Medicine

## 2021-05-18 DIAGNOSIS — N401 Enlarged prostate with lower urinary tract symptoms: Secondary | ICD-10-CM

## 2021-05-19 ENCOUNTER — Telehealth: Payer: Self-pay | Admitting: Neurology

## 2021-05-19 ENCOUNTER — Encounter: Payer: BC Managed Care – PPO | Admitting: Diagnostic Neuroimaging

## 2021-05-19 ENCOUNTER — Ambulatory Visit (INDEPENDENT_AMBULATORY_CARE_PROVIDER_SITE_OTHER): Payer: BC Managed Care – PPO | Admitting: Diagnostic Neuroimaging

## 2021-05-19 DIAGNOSIS — G5622 Lesion of ulnar nerve, left upper limb: Secondary | ICD-10-CM

## 2021-05-19 DIAGNOSIS — Z0289 Encounter for other administrative examinations: Secondary | ICD-10-CM

## 2021-05-19 NOTE — Telephone Encounter (Signed)
  I tried to call the patient, unable to reach him.  I will try calling back later.  The patient does show a severe ulnar neuropathy that appears to be at the elbow, not distal.  The patient also has ulnar neuropathy on the right and mild bilateral carpal tunnel syndrome.  If the patient is amenable to a surgical referral, I will get this set up.   EMG and NCV study 05/19/21:  IMPRESSION:    Abnormal study demonstrating: -Severe left ulnar neuropathy with absent sensory and motor responses throughout.  Active and chronic denervation noted in left first dorsal interosseous and chronic denervation noted in left flexor carpi ulnaris. 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 -Bilateral median neuropathies at the wrists, may represent mild bilateral carpal the patient is amenable

## 2021-05-19 NOTE — Procedures (Signed)
[facility]  NCS (NERVE CONDUCTION STUDY) WITH EMG (ELECTROMYOGRAPHY) REPORT   STUDY DATE: 05/19/21 PATIENT NAME: Jeffery Murray DOB: 23-Mar-1966 MRN: [id number]  ORDERING CLINICIAN: York Spaniel, MD   TECHNOLOGIST: Durenda Age  ELECTROMYOGRAPHER: Glenford Bayley. Twania Bujak, MD  CLINICAL INFORMATION: 55 year old male with left arm and hand numbness and weakness.  Evaluate for left ulnar neuropathy.  FINDINGS: NERVE CONDUCTION STUDY:  Bilateral median motor responses have prolonged distal latencies, normal amplitudes and normal conduction velocities.  Left ulnar motor response cannot be obtained.  Right ulnar motor response has normal distal latency, decreased amplitude, normal conduction velocity with stimulation below the elbow and slow conduction velocity with stimulation above the elbow (40 m/s).  Bilateral radial sensory responses are normal.  Bilateral median sensory responses have decreased amplitudes and prolonged peak latencies.  Bilateral ulnar sensory responses cannot be obtained.  Left ulnar F-wave latency could not be obtained.  Right ulnar F wave latency is slightly prolonged.   NEEDLE ELECTROMYOGRAPHY:  Needle examination of left upper extremity and left cervical paraspinal muscles demonstrates chronic denervation in left flexor carpi ulnaris and left first dorsal interosseous muscles with decreased recruitment of large motor units on exertion.  Abnormal spontaneous activity noted in left first dorsal interosseous at rest.  Remaining muscles are unremarkable.   IMPRESSION:   Abnormal study demonstrating: -Severe left ulnar neuropathy with absent sensory and motor responses throughout.  Active and chronic denervation noted in left first dorsal interosseous and chronic denervation noted in left flexor carpi ulnaris. 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 -Bilateral median neuropathies at the wrists, may represent mild  bilateral carpal tunnel syndrome.     INTERPRETING PHYSICIAN:  Suanne Marker, MD Certified in Neurology, Neurophysiology and Neuroimaging  [facility] [address], [address] [place], [place] [address] [phone]   [facility]    Nerve / Sites Muscle Latency Ref. Amplitude Ref. Rel Amp Segments Distance Velocity Ref. Area    ms ms mV mV %  cm m/s m/s mVms  L Median - APB     Wrist APB 4.8 ?4.4 8.4 ?4.0 100 Wrist - APB 7   31.2     Upper arm APB 9.6  7.7  91.4 Upper arm - Wrist 24 49 ?49 29.7  R Median - APB     Wrist APB 5.3 ?4.4 5.1 ?4.0 100 Wrist - APB 7   17.5     Upper arm APB 10.1  4.8  92.9 Upper arm - Wrist 23 49 ?49 17.1  L Ulnar - ADM     Wrist ADM NR ?3.3 NR ?6.0 NR Wrist - ADM 7   NR     B.Elbow ADM NR  NR  NR B.Elbow - Wrist 23 NR ?49 NR  R Ulnar - ADM     Wrist ADM 2.9 ?3.3 5.6 ?6.0 100 Wrist - ADM 7   14.2     B.Elbow ADM 7.6  4.5  79.7 B.Elbow - Wrist 23 49 ?49 14.1     A.Elbow ADM 10.1  4.9  108 A.Elbow - B.Elbow 10 40 ?49 16.6             SNC    Nerve / Sites Rec. Site Peak Lat Ref.  Amp Ref. Segments Distance    ms ms V V  cm  L Radial - Anatomical snuff box (Forearm)     Forearm Wrist 2.5 ?2.9 20 ?15 Forearm - Wrist 10  R Radial - Anatomical snuff box (  Forearm)     Forearm Wrist 2.5 ?2.9 22 ?15 Forearm - Wrist 10  L Median - Orthodromic (Dig II, Mid palm)     Dig II Wrist 4.1 ?3.4 6 ?10 Dig II - Wrist 13  R Median - Orthodromic (Dig II, Mid palm)     Dig II Wrist 4.6 ?3.4 3 ?10 Dig II - Wrist 13  L Ulnar - Orthodromic, (Dig V, Mid palm)     Dig V Wrist NR ?3.1 NR ?5 Dig V - Wrist 11  R Ulnar - Orthodromic, (Dig V, Mid palm)     Dig V Wrist NR ?3.1 NR ?5 Dig V - Wrist 14                 F  Wave    Nerve F Lat Ref.   ms ms  L Ulnar - ADM NR ?32.0  R Ulnar - ADM 35.7 ?32.0         EMG Summary Table    Spontaneous MUAP Recruitment  Muscle IA Fib PSW Fasc Other Amp Dur. Poly Pattern  L. Deltoid Normal None None None _______  Normal Normal Normal Normal  L. Biceps brachii Normal None None None _______ Normal Normal Normal Normal  L. Triceps brachii Normal None None None _______ Normal Normal Normal Normal  L. Flexor carpi radialis Normal None None None _______ Normal Normal Normal Normal  L. Flexor carpi ulnaris Normal None None None _______ Increased Normal Normal Reduced  L. First dorsal interosseous Normal 1+ 1+ None _______ Increased Normal Normal Reduced  L. Cervical paraspinals Normal None None None _______ Normal Normal Normal Normal

## 2021-05-21 NOTE — Telephone Encounter (Signed)
I called the patient.  EMG study shows evidence of a relatively severe left ulnar neuropathy at the elbow, mild bilateral carpal tunnel syndrome and a more mild right ulnar neuropathy.  Given the severity of the left ulnar nerve injury, we will consider a surgical referral, the patient is amenable to this.

## 2021-05-25 NOTE — Telephone Encounter (Signed)
Referral sent to [facility]/Dr. Merlyn Lot. Phone: [phone].

## 2021-06-29 ENCOUNTER — Other Ambulatory Visit: Payer: Self-pay | Admitting: Internal Medicine

## 2021-06-29 DIAGNOSIS — N401 Enlarged prostate with lower urinary tract symptoms: Secondary | ICD-10-CM

## 2021-07-11 DIAGNOSIS — G5622 Lesion of ulnar nerve, left upper limb: Secondary | ICD-10-CM | POA: Insufficient documentation

## 2021-07-11 DIAGNOSIS — G5603 Carpal tunnel syndrome, bilateral upper limbs: Secondary | ICD-10-CM | POA: Insufficient documentation

## 2021-07-11 DIAGNOSIS — M47812 Spondylosis without myelopathy or radiculopathy, cervical region: Secondary | ICD-10-CM | POA: Insufficient documentation

## 2022-01-11 ENCOUNTER — Other Ambulatory Visit: Payer: Self-pay | Admitting: *Deleted

## 2022-01-11 DIAGNOSIS — Z87891 Personal history of nicotine dependence: Secondary | ICD-10-CM

## 2022-01-11 DIAGNOSIS — Z122 Encounter for screening for malignant neoplasm of respiratory organs: Secondary | ICD-10-CM

## 2022-01-11 DIAGNOSIS — F17210 Nicotine dependence, cigarettes, uncomplicated: Secondary | ICD-10-CM

## 2022-01-27 ENCOUNTER — Other Ambulatory Visit: Payer: Self-pay | Admitting: Internal Medicine

## 2022-01-27 DIAGNOSIS — N401 Enlarged prostate with lower urinary tract symptoms: Secondary | ICD-10-CM

## 2022-02-28 ENCOUNTER — Other Ambulatory Visit: Payer: BC Managed Care – PPO

## 2022-06-27 ENCOUNTER — Other Ambulatory Visit: Payer: Self-pay | Admitting: Internal Medicine

## 2022-06-27 ENCOUNTER — Telehealth: Payer: Self-pay | Admitting: Internal Medicine

## 2022-06-27 DIAGNOSIS — N401 Enlarged prostate with lower urinary tract symptoms: Secondary | ICD-10-CM

## 2022-06-27 NOTE — Telephone Encounter (Signed)
Patient needs his flomax refilled - Please send to [facility] on [address] in [place], [place]

## 2022-06-28 NOTE — Telephone Encounter (Signed)
FYI:  Patient is very agitated and said that he is going to show up here tomorrow.  I tried to explain to patient that he had not been seen by Dr. Ronnald Ramp in 1 1/2 years.

## 2022-06-28 NOTE — Telephone Encounter (Signed)
Patient has scheduled an appointment for December. Please ask Dr. Ronnald Ramp if he is willing to do a refill to make it to the appointment.

## 2022-06-29 ENCOUNTER — Other Ambulatory Visit: Payer: Self-pay | Admitting: Internal Medicine

## 2022-06-29 ENCOUNTER — Ambulatory Visit (HOSPITAL_COMMUNITY)
Admission: EM | Admit: 2022-06-29 | Discharge: 2022-06-29 | Disposition: A | Discharge status: Home / Self Care | Payer: Self-pay | Attending: Family Medicine | Admitting: Family Medicine

## 2022-06-29 DIAGNOSIS — N401 Enlarged prostate with lower urinary tract symptoms: Secondary | ICD-10-CM

## 2022-06-29 MED ORDER — TAMSULOSIN HCL 0.4 MG PO CAPS: ORAL_CAPSULE | ORAL | 0 refills | Status: DC

## 2022-06-29 NOTE — ED Notes (Signed)
Pt here for med refill but it looks like Dr Ronnald Ramp has sent refill this morning. He was not seen

## 2022-07-26 ENCOUNTER — Encounter: Payer: Self-pay | Admitting: *Deleted

## 2022-09-23 ENCOUNTER — Other Ambulatory Visit: Payer: Self-pay | Admitting: Internal Medicine

## 2022-09-23 DIAGNOSIS — N401 Enlarged prostate with lower urinary tract symptoms: Secondary | ICD-10-CM

## 2022-10-04 ENCOUNTER — Encounter: Payer: Self-pay | Admitting: Internal Medicine

## 2022-10-04 ENCOUNTER — Other Ambulatory Visit: Payer: Self-pay | Admitting: Internal Medicine

## 2022-10-04 ENCOUNTER — Ambulatory Visit (INDEPENDENT_AMBULATORY_CARE_PROVIDER_SITE_OTHER): Payer: Self-pay | Admitting: Internal Medicine

## 2022-10-04 VITALS — BP 120/70 | HR 88 | Temp 98.2°F | Ht 67.0 in | Wt 160.0 lb

## 2022-10-04 DIAGNOSIS — N401 Enlarged prostate with lower urinary tract symptoms: Secondary | ICD-10-CM

## 2022-10-04 DIAGNOSIS — N410 Acute prostatitis: Secondary | ICD-10-CM | POA: Insufficient documentation

## 2022-10-04 DIAGNOSIS — R3916 Straining to void: Secondary | ICD-10-CM

## 2022-10-04 DIAGNOSIS — R197 Diarrhea, unspecified: Secondary | ICD-10-CM | POA: Insufficient documentation

## 2022-10-04 DIAGNOSIS — R972 Elevated prostate specific antigen [PSA]: Secondary | ICD-10-CM

## 2022-10-04 DIAGNOSIS — R300 Dysuria: Secondary | ICD-10-CM

## 2022-10-04 DIAGNOSIS — Z23 Encounter for immunization: Secondary | ICD-10-CM

## 2022-10-04 DIAGNOSIS — E785 Hyperlipidemia, unspecified: Secondary | ICD-10-CM

## 2022-10-04 DIAGNOSIS — R7303 Prediabetes: Secondary | ICD-10-CM

## 2022-10-04 DIAGNOSIS — Z720 Tobacco use: Secondary | ICD-10-CM

## 2022-10-04 DIAGNOSIS — E119 Type 2 diabetes mellitus without complications: Secondary | ICD-10-CM | POA: Insufficient documentation

## 2022-10-04 DIAGNOSIS — Z125 Encounter for screening for malignant neoplasm of prostate: Secondary | ICD-10-CM

## 2022-10-04 DIAGNOSIS — Z0001 Encounter for general adult medical examination with abnormal findings: Secondary | ICD-10-CM

## 2022-10-04 DIAGNOSIS — R8281 Pyuria: Secondary | ICD-10-CM

## 2022-10-04 DIAGNOSIS — R3121 Asymptomatic microscopic hematuria: Secondary | ICD-10-CM

## 2022-10-04 DIAGNOSIS — K5730 Diverticulosis of large intestine without perforation or abscess without bleeding: Secondary | ICD-10-CM | POA: Insufficient documentation

## 2022-10-04 LAB — URINALYSIS, ROUTINE W REFLEX MICROSCOPIC
Bilirubin Urine: NEGATIVE
Ketones, ur: NEGATIVE
Nitrite: POSITIVE — AB
Specific Gravity, Urine: 1.025 (ref 1.000–1.030)
Total Protein, Urine: 30 — AB
Urine Glucose: NEGATIVE
Urobilinogen, UA: 0.2 (ref 0.0–1.0)
pH: 6 (ref 5.0–8.0)

## 2022-10-04 LAB — BASIC METABOLIC PANEL
BUN: 14 mg/dL (ref 6–23)
CO2: 30 mEq/L (ref 19–32)
Calcium: 9.6 mg/dL (ref 8.4–10.5)
Chloride: 104 mEq/L (ref 96–112)
Creatinine, Ser: 0.91 mg/dL (ref 0.40–1.50)
GFR: 92.84 mL/min (ref >60.00)
Glucose, Bld: 92 mg/dL (ref 70–99)
Potassium: 4.6 mEq/L (ref 3.5–5.1)
Sodium: 139 mEq/L (ref 135–145)

## 2022-10-04 LAB — HEPATIC FUNCTION PANEL
ALT: 18 U/L (ref 0–53)
AST: 21 U/L (ref 0–37)
Albumin: 4 g/dL (ref 3.5–5.2)
Alkaline Phosphatase: 65 U/L (ref 39–117)
Bilirubin, Direct: 0.1 mg/dL (ref 0.0–0.3)
Total Bilirubin: 0.4 mg/dL (ref 0.2–1.2)
Total Protein: 6.7 g/dL (ref 6.0–8.3)

## 2022-10-04 LAB — CBC WITH DIFFERENTIAL/PLATELET
Basophils Absolute: 0.1 10*3/uL (ref 0.0–0.1)
Basophils Relative: 0.9 % (ref 0.0–3.0)
Eosinophils Absolute: 0.1 10*3/uL (ref 0.0–0.7)
Eosinophils Relative: 1.2 % (ref 0.0–5.0)
HCT: 45.7 % (ref 39.0–52.0)
Hemoglobin: 15.9 g/dL (ref 13.0–17.0)
Lymphocytes Relative: 26.3 % (ref 12.0–46.0)
Lymphs Abs: 2.4 10*3/uL (ref 0.7–4.0)
MCHC: 34.9 g/dL (ref 30.0–36.0)
MCV: 84.8 fl (ref 78.0–100.0)
Monocytes Absolute: 0.6 10*3/uL (ref 0.1–1.0)
Monocytes Relative: 6.2 % (ref 3.0–12.0)
Neutro Abs: 5.9 10*3/uL (ref 1.4–7.7)
Neutrophils Relative %: 65.4 % (ref 43.0–77.0)
Platelets: 293 10*3/uL (ref 150.0–400.0)
RBC: 5.39 Mil/uL (ref 4.22–5.81)
RDW: 13.5 % (ref 11.5–15.5)
WBC: 9 10*3/uL (ref 4.0–10.5)

## 2022-10-04 LAB — LIPID PANEL
Cholesterol: 145 mg/dL (ref 0–200)
HDL: 48.6 mg/dL (ref >39.00)
LDL Cholesterol: 86 mg/dL (ref 0–99)
NonHDL: 96.05
Total CHOL/HDL Ratio: 3
Triglycerides: 50 mg/dL (ref 0.0–149.0)
VLDL: 10 mg/dL (ref 0.0–40.0)

## 2022-10-04 LAB — HEMOGLOBIN A1C: Hgb A1c MFr Bld: 6.2 % (ref 4.6–6.5)

## 2022-10-04 LAB — PSA: PSA: 15.07 ng/mL — ABNORMAL HIGH (ref 0.10–4.00)

## 2022-10-04 LAB — TSH: TSH: 2.18 u[IU]/mL (ref 0.35–5.50)

## 2022-10-04 MED ORDER — LEVOFLOXACIN 500 MG PO TABS: 500.0000 mg | ORAL_TABLET | Freq: Every day | ORAL | 0 refills | Status: AC

## 2022-10-04 MED ORDER — TAMSULOSIN HCL 0.4 MG PO CAPS: 0.8000 mg | ORAL_CAPSULE | Freq: Every day | ORAL | 0 refills | Status: DC

## 2022-10-04 NOTE — Patient Instructions (Signed)
Health Maintenance, Male Adopting a healthy lifestyle and getting preventive care are important in promoting health and wellness. Ask your health care provider about: The right schedule for you to have regular tests and exams. Things you can do on your own to prevent diseases and keep yourself healthy. What should I know about diet, weight, and exercise? Eat a healthy diet  Eat a diet that includes plenty of vegetables, fruits, low-fat dairy products, and lean protein. Do not eat a lot of foods that are high in solid fats, added sugars, or sodium. Maintain a healthy weight Body mass index (BMI) is a measurement that can be used to identify possible weight problems. It estimates body fat based on height and weight. Your health care provider can help determine your BMI and help you achieve or maintain a healthy weight. Get regular exercise Get regular exercise. This is one of the most important things you can do for your health. Most adults should: Exercise for at least 150 minutes each week. The exercise should increase your heart rate and make you sweat (moderate-intensity exercise). Do strengthening exercises at least twice a week. This is in addition to the moderate-intensity exercise. Spend less time sitting. Even light physical activity can be beneficial. Watch cholesterol and blood lipids Have your blood tested for lipids and cholesterol at 58 years of age, then have this test every 5 years. You may need to have your cholesterol levels checked more often if: Your lipid or cholesterol levels are high. You are older than 58 years of age. You are at high risk for heart disease. What should I know about cancer screening? Many types of cancers can be detected early and may often be prevented. Depending on your health history and family history, you may need to have cancer screening at various ages. This may include screening for: Colorectal cancer. Prostate cancer. Skin cancer. Lung  cancer. What should I know about heart disease, diabetes, and high blood pressure? Blood pressure and heart disease High blood pressure causes heart disease and increases the risk of stroke. This is more likely to develop in people who have high blood pressure readings or are overweight. Talk with your health care provider about your target blood pressure readings. Have your blood pressure checked: Every 3-5 years if you are 18-39 years of age. Every year if you are 40 years old or older. If you are between the ages of 65 and 75 and are a current or former smoker, ask your health care provider if you should have a one-time screening for abdominal aortic aneurysm (AAA). Diabetes Have regular diabetes screenings. This checks your fasting blood sugar level. Have the screening done: Once every three years after age 45 if you are at a normal weight and have a low risk for diabetes. More often and at a younger age if you are overweight or have a high risk for diabetes. What should I know about preventing infection? Hepatitis B If you have a higher risk for hepatitis B, you should be screened for this virus. Talk with your health care provider to find out if you are at risk for hepatitis B infection. Hepatitis C Blood testing is recommended for: Everyone born from 1945 through 1965. Anyone with known risk factors for hepatitis C. Sexually transmitted infections (STIs) You should be screened each year for STIs, including gonorrhea and chlamydia, if: You are sexually active and are younger than 58 years of age. You are older than 58 years of age and your   health care provider tells you that you are at risk for this type of infection. Your sexual activity has changed since you were last screened, and you are at increased risk for chlamydia or gonorrhea. Ask your health care provider if you are at risk. Ask your health care provider about whether you are at high risk for HIV. Your health care provider  may recommend a prescription medicine to help prevent HIV infection. If you choose to take medicine to prevent HIV, you should first get tested for HIV. You should then be tested every 3 months for as long as you are taking the medicine. Follow these instructions at home: Alcohol use Do not drink alcohol if your health care provider tells you not to drink. If you drink alcohol: Limit how much you have to 0-2 drinks a day. Know how much alcohol is in your drink. In the [place] one drink equals one 12 oz bottle of beer (355 mL), one 5 oz glass of wine (148 mL), or one 1 oz glass of hard liquor (44 mL). Lifestyle Do not use any products that contain nicotine or tobacco. These products include cigarettes, chewing tobacco, and vaping devices, such as e-cigarettes. If you need help quitting, ask your health care provider. Do not use street drugs. Do not share needles. Ask your health care provider for help if you need support or information about quitting drugs. General instructions Schedule regular health, dental, and eye exams. Stay current with your vaccines. Tell your health care provider if: You often feel depressed. You have ever been abused or do not feel safe at home. Summary Adopting a healthy lifestyle and getting preventive care are important in promoting health and wellness. Follow your health care provider's instructions about healthy diet, exercising, and getting tested or screened for diseases. Follow your health care provider's instructions on monitoring your cholesterol and blood pressure. This information is not intended to replace advice given to you by your health care provider. Make sure you discuss any questions you have with your health care provider. Document Revised: 02/14/2021 Document Reviewed: 02/14/2021 Elsevier Patient Education  2023 [employer]  

## 2022-10-04 NOTE — Progress Notes (Signed)
Subjective:  Patient ID: Jeffery Murray, male    DOB: 1966/09/21  Age: 56 y.o. MRN: [id number]  CC: Annual Exam   HPI Jeffery Murray presents for a CPX and f/up -  He complains of a several week history of gross hematuria, frequency, dysuria, and nocturia.  He is active and denies chest pain, shortness of breath, diaphoresis, or edema.  Outpatient Medications Prior to Visit  Medication Sig Dispense Refill   tamsulosin (FLOMAX) 0.4 MG CAPS capsule TAKE 2 CAPSULES(0.8 MG) BY MOUTH DAILY AFTER AND SUPPER 180 capsule 0   acetaminophen (TYLENOL) 500 MG tablet Take 500 mg by mouth every 6 (six) hours as needed. (Patient not taking: Reported on 10/04/2022)     dutasteride (AVODART) 0.5 MG capsule Take 1 capsule (0.5 mg total) by mouth daily. (Patient not taking: Reported on 10/04/2022) 90 capsule 1   No facility-administered medications prior to visit.    ROS Review of Systems  Constitutional: Negative.  Negative for chills, diaphoresis, fatigue and fever.  HENT: Negative.    Eyes: Negative.   Respiratory:  Negative for cough, chest tightness, shortness of breath and wheezing.   Cardiovascular:  Negative for chest pain, palpitations and leg swelling.  Gastrointestinal:  Negative for abdominal pain, constipation, diarrhea, nausea and vomiting.  Endocrine: Negative.   Genitourinary:  Positive for difficulty urinating, dysuria, frequency and hematuria. Negative for flank pain, genital sores, scrotal swelling, testicular pain and urgency.  Musculoskeletal: Negative.  Negative for arthralgias, myalgias and neck pain.  Skin: Negative.   Neurological: Negative.  Negative for dizziness and weakness.  Hematological:  Negative for adenopathy. Does not bruise/bleed easily.  Psychiatric/Behavioral: Negative.      Objective:  BP 120/70 (BP Location: Right Arm, Patient Position: Sitting, Cuff Size: Normal)   Pulse 88   Temp 98.2 F (36.8 C) (Oral)   Ht 5\' 7"  (1.702 m)   Wt 160 lb (72.6  kg)   SpO2 98%   BMI 25.06 kg/m   BP Readings from Last 3 Encounters:  10/04/22 120/70  04/26/21 131/82  12/15/20 126/84    Wt Readings from Last 3 Encounters:  10/04/22 160 lb (72.6 kg)  04/26/21 164 lb (74.4 kg)  12/15/20 162 lb (73.5 kg)    Physical Exam Vitals reviewed. Exam conducted with a chaperone present.  Constitutional:      Appearance: He is not ill-appearing.  HENT:     Nose: Nose normal.     Mouth/Throat:     Mouth: Mucous membranes are moist.  Eyes:     General: No scleral icterus.    Conjunctiva/sclera: Conjunctivae normal.  Cardiovascular:     Rate and Rhythm: Normal rate and regular rhythm.     Pulses:          Carotid pulses are 1+ on the right side and 1+ on the left side.      Radial pulses are 1+ on the right side and 1+ on the left side.       Femoral pulses are 1+ on the right side and 1+ on the left side.      Popliteal pulses are 1+ on the right side and 1+ on the left side.       Dorsalis pedis pulses are 1+ on the right side and 1+ on the left side.       Posterior tibial pulses are 1+ on the right side and 1+ on the left side.     Heart sounds: No murmur heard.  No gallop.  Pulmonary:     Effort: Pulmonary effort is normal.     Breath sounds: No stridor. No wheezing, rhonchi or rales.  Abdominal:     General: Abdomen is flat.     Palpations: There is no mass.     Tenderness: There is no abdominal tenderness. There is no guarding or rebound.     Hernia: No hernia is present. There is no hernia in the left inguinal area or right inguinal area.  Genitourinary:    Penis: Normal.      Testes: Normal.     Epididymis:     Right: Normal.     Left: Normal.     Prostate: Enlarged. Not tender and no nodules present.     Rectum: Normal. Guaiac result negative. No mass, tenderness, anal fissure, external hemorrhoid or internal hemorrhoid. Normal anal tone.  Musculoskeletal:        General: Normal range of motion.     Cervical back: Neck  supple.     Right lower leg: No edema.     Left lower leg: No edema.  Lymphadenopathy:     Cervical: No cervical adenopathy.     Lower Body: No right inguinal adenopathy. No left inguinal adenopathy.  Skin:    General: Skin is warm and dry.  Neurological:     General: No focal deficit present.     Mental Status: He is alert.  Psychiatric:        Mood and Affect: Mood normal.        Behavior: Behavior normal.     Lab Results  Component Value Date   WBC 9.0 10/04/2022   HGB 15.9 10/04/2022   HCT 45.7 10/04/2022   PLT 293.0 10/04/2022   GLUCOSE 92 10/04/2022   CHOL 145 10/04/2022   TRIG 50.0 10/04/2022   HDL 48.60 10/04/2022   LDLCALC 86 10/04/2022   ALT 18 10/04/2022   AST 21 10/04/2022   NA 139 10/04/2022   K 4.6 10/04/2022   CL 104 10/04/2022   CREATININE 0.91 10/04/2022   BUN 14 10/04/2022   CO2 30 10/04/2022   TSH 2.18 10/04/2022   PSA 15.07 (H) 10/04/2022   HGBA1C 6.2 10/04/2022    CT CHEST LUNG CA SCREEN LOW DOSE W/O CM  Result Date: 12/14/2020 CLINICAL DATA:  56 year old male current smoker with 39 pack-year history of smoking. Lung cancer screening examination. EXAM: CT CHEST WITHOUT CONTRAST LOW-DOSE FOR LUNG CANCER SCREENING TECHNIQUE: Multidetector CT imaging of the chest was performed following the standard protocol without IV contrast. COMPARISON:  No priors. FINDINGS: Cardiovascular: Heart size is normal. There is no significant pericardial fluid, thickening or pericardial calcification. There is aortic atherosclerosis, as well as atherosclerosis of the great vessels of the mediastinum and the coronary arteries, including calcified atherosclerotic plaque in the left anterior descending and left circumflex coronary arteries. Mediastinum/Nodes: No pathologically enlarged mediastinal or hilar lymph nodes. Please note that accurate exclusion of hilar adenopathy is limited on noncontrast CT scans. Esophagus is unremarkable in appearance. No axillary lymphadenopathy.  Lungs/Pleura: Multiple tiny calcified granulomas are noted in the lungs. No other suspicious appearing pulmonary nodules or masses are noted. No acute consolidative airspace disease. No pleural effusions. Mild diffuse bronchial wall thickening with mild centrilobular and paraseptal emphysema. Upper Abdomen: Aortic atherosclerosis. Musculoskeletal: There are no aggressive appearing lytic or blastic lesions noted in the visualized portions of the skeleton. IMPRESSION: 1. Lung-RADS 1S, negative. Continue annual screening with low-dose chest CT without contrast in 12  months. 2. The "S" modifier above refers to potentially clinically significant non lung cancer related findings. Specifically, there is aortic atherosclerosis, in addition to 2 vessel coronary artery disease. Please note that although the presence of coronary artery calcium documents the presence of coronary artery disease, the severity of this disease and any potential stenosis cannot be assessed on this non-gated CT examination. Assessment for potential risk factor modification, dietary therapy or pharmacologic therapy may be warranted, if clinically indicated. 3. Mild diffuse bronchial wall thickening with mild centrilobular and paraseptal emphysema; imaging findings suggestive of underlying COPD. Aortic Atherosclerosis (ICD10-I70.0) and Emphysema (ICD10-J43.9). Electronically Signed   By: Trudie Reedaniel  Entrikin M.D.   On: 12/14/2020 08:45    Assessment & Plan:   Jeffery Murray was seen today for annual exam.  Diagnoses and all orders for this visit:  Asymptomatic microscopic hematuria -     CBC with Differential/Platelet; Future -     Urinalysis, Routine w reflex microscopic; Future -     Urinalysis, Routine w reflex microscopic -     CBC with Differential/Platelet  Prediabetes- His A1c is 6.2%. -     Basic metabolic panel; Future -     CBC with Differential/Platelet; Future -     Hemoglobin A1c; Future -     Hemoglobin A1c -     CBC with  Differential/Platelet -     Basic metabolic panel  Hyperlipidemia with target LDL less than 130- His ASCVD risk or is 19%.  I recommended that he start taking a statin. -     Lipid panel; Future -     CBC with Differential/Platelet; Future -     TSH; Future -     Hepatic function panel; Future -     Hepatic function panel -     TSH -     CBC with Differential/Platelet -     Lipid panel -     rosuvastatin (CRESTOR) 10 MG tablet; Take 1 tablet (10 mg total) by mouth daily.  Tobacco abuse -     CBC with Differential/Platelet; Future -     Ambulatory Referral for Lung Cancer Scre -     CBC with Differential/Platelet  PSA elevation -     Ambulatory referral to Urology  Encounter for general adult medical examination with abnormal findings- Exam completed, labs reviewed, vaccines reviewed and updated, cancer screenings addressed, patient education was given. -     PSA; Future -     PSA  Need for vaccination -     Flu Vaccine QUAD 6+ mos PF IM (Fluarix Quad PF)  Acute prostatitis- Will treat with a  FQ. -     CULTURE, URINE COMPREHENSIVE; Future -     Chlamydia/Gonococcus/Trichomonas, NAA; Future -     levofloxacin (LEVAQUIN) 500 MG tablet; Take 1 tablet (500 mg total) by mouth daily for 14 days.  Pyuria -     CULTURE, URINE COMPREHENSIVE; Future -     Chlamydia/Gonococcus/Trichomonas, NAA; Future  Dysuria -     CULTURE, URINE COMPREHENSIVE; Future -     Chlamydia/Gonococcus/Trichomonas, NAA; Future  Benign prostatic hyperplasia (BPH) with straining on urination -     tamsulosin (FLOMAX) 0.4 MG CAPS capsule; Take 2 capsules (0.8 mg total) by mouth daily after supper.   I have changed Jeffery Murray's tamsulosin. I am also having him start on levofloxacin and rosuvastatin. Additionally, I am having him maintain his acetaminophen and dutasteride.  Meds ordered this encounter  Medications   tamsulosin (FLOMAX) 0.4  MG CAPS capsule    Sig: Take 2 capsules (0.8 mg total) by  mouth daily after supper.    Dispense:  180 capsule    Refill:  0   levofloxacin (LEVAQUIN) 500 MG tablet    Sig: Take 1 tablet (500 mg total) by mouth daily for 14 days.    Dispense:  14 tablet    Refill:  0   rosuvastatin (CRESTOR) 10 MG tablet    Sig: Take 1 tablet (10 mg total) by mouth daily.    Dispense:  90 tablet    Refill:  1     Follow-up: Return in about 6 months (around 04/05/2023).  Sanda Linger, MD

## 2022-10-07 MED ORDER — ROSUVASTATIN CALCIUM 10 MG PO TABS: 10.0000 mg | ORAL_TABLET | Freq: Every day | ORAL | 1 refills | Status: DC

## 2022-12-28 ENCOUNTER — Other Ambulatory Visit: Payer: Self-pay | Admitting: Internal Medicine

## 2022-12-28 DIAGNOSIS — R3916 Straining to void: Secondary | ICD-10-CM

## 2023-01-09 ENCOUNTER — Telehealth: Payer: Self-pay | Admitting: Internal Medicine

## 2023-01-09 DIAGNOSIS — N401 Enlarged prostate with lower urinary tract symptoms: Secondary | ICD-10-CM

## 2023-01-09 DIAGNOSIS — E785 Hyperlipidemia, unspecified: Secondary | ICD-10-CM

## 2023-01-09 MED ORDER — ROSUVASTATIN CALCIUM 10 MG PO TABS: 10.0000 mg | ORAL_TABLET | Freq: Every day | ORAL | 0 refills | Status: DC

## 2023-01-09 MED ORDER — TAMSULOSIN HCL 0.4 MG PO CAPS: ORAL_CAPSULE | ORAL | 0 refills | Status: AC

## 2023-01-09 NOTE — Telephone Encounter (Signed)
Prescription Request  01/09/2023  LOV: 10/04/2022  What is the name of the medication or equipment? Tamsolosin and resouvastatin  Have you contacted your pharmacy to request a refill? Yes   Which pharmacy would you like this sent to?   [id number] [address], [place] [address] Phone: [phone] Fax: [phone]    Patient notified that their request is being sent to the clinical staff for review and that they should receive a response within 2 business days.   Please advise at Mobile [phone] (mobile)

## 2023-04-06 ENCOUNTER — Other Ambulatory Visit: Payer: Self-pay | Admitting: Internal Medicine

## 2023-04-06 ENCOUNTER — Other Ambulatory Visit: Payer: Self-pay | Admitting: Urology

## 2023-04-06 DIAGNOSIS — N401 Enlarged prostate with lower urinary tract symptoms: Secondary | ICD-10-CM

## 2023-04-06 DIAGNOSIS — R972 Elevated prostate specific antigen [PSA]: Secondary | ICD-10-CM

## 2023-04-06 LAB — PSA: PSA: 10

## 2023-04-09 ENCOUNTER — Other Ambulatory Visit: Payer: Self-pay | Admitting: Internal Medicine

## 2023-04-09 DIAGNOSIS — N401 Enlarged prostate with lower urinary tract symptoms: Secondary | ICD-10-CM

## 2023-05-21 ENCOUNTER — Encounter: Payer: Self-pay | Admitting: Urology

## 2023-05-24 ENCOUNTER — Ambulatory Visit
Admission: RE | Admit: 2023-05-24 | Discharge: 2023-05-24 | Disposition: A | Discharge status: Home / Self Care | Payer: Self-pay | Source: Ambulatory Visit | Attending: Urology | Admitting: Urology

## 2023-05-24 DIAGNOSIS — R972 Elevated prostate specific antigen [PSA]: Secondary | ICD-10-CM

## 2023-05-24 MED ORDER — GADOPICLENOL 0.5 MMOL/ML IV SOLN
7.5000 mL | Freq: Once | INTRAVENOUS | Status: AC | PRN
  Administered 2023-05-24: 7.5 mL via INTRAVENOUS

## 2023-05-24 MED ORDER — GADOPICLENOL 0.5 MMOL/ML IV SOLN: 9.0000 mL | Freq: Once | INTRAVENOUS | Status: DC | PRN

## 2023-07-05 ENCOUNTER — Other Ambulatory Visit: Payer: Self-pay | Admitting: Internal Medicine

## 2023-07-05 DIAGNOSIS — E785 Hyperlipidemia, unspecified: Secondary | ICD-10-CM

## 2023-07-17 ENCOUNTER — Encounter: Payer: Self-pay | Admitting: Internal Medicine

## 2023-07-17 ENCOUNTER — Ambulatory Visit: Payer: 59 | Admitting: Internal Medicine

## 2023-07-17 VITALS — BP 112/66 | HR 90 | Temp 98.1°F | Resp 16 | Ht 67.0 in | Wt 165.0 lb

## 2023-07-17 DIAGNOSIS — I9589 Other hypotension: Secondary | ICD-10-CM | POA: Diagnosis not present

## 2023-07-17 DIAGNOSIS — E785 Hyperlipidemia, unspecified: Secondary | ICD-10-CM

## 2023-07-17 DIAGNOSIS — R0609 Other forms of dyspnea: Secondary | ICD-10-CM | POA: Insufficient documentation

## 2023-07-17 DIAGNOSIS — R7303 Prediabetes: Secondary | ICD-10-CM | POA: Diagnosis not present

## 2023-07-17 DIAGNOSIS — Z720 Tobacco use: Secondary | ICD-10-CM

## 2023-07-17 DIAGNOSIS — Z23 Encounter for immunization: Secondary | ICD-10-CM

## 2023-07-17 DIAGNOSIS — I959 Hypotension, unspecified: Secondary | ICD-10-CM | POA: Insufficient documentation

## 2023-07-17 LAB — CBC WITH DIFFERENTIAL/PLATELET
Basophils Absolute: 0.1 10*3/uL (ref 0.0–0.1)
Basophils Relative: 0.8 % (ref 0.0–3.0)
Eosinophils Absolute: 0.1 10*3/uL (ref 0.0–0.7)
Eosinophils Relative: 1 % (ref 0.0–5.0)
HCT: 47 % (ref 39.0–52.0)
Hemoglobin: 16.3 g/dL (ref 13.0–17.0)
Lymphocytes Relative: 20.2 % (ref 12.0–46.0)
Lymphs Abs: 2.3 10*3/uL (ref 0.7–4.0)
MCHC: 34.6 g/dL (ref 30.0–36.0)
MCV: 85.8 fL (ref 78.0–100.0)
Monocytes Absolute: 0.5 10*3/uL (ref 0.1–1.0)
Monocytes Relative: 4.8 % (ref 3.0–12.0)
Neutro Abs: 8.3 10*3/uL — ABNORMAL HIGH (ref 1.4–7.7)
Neutrophils Relative %: 73.2 % (ref 43.0–77.0)
Platelets: 281 10*3/uL (ref 150.0–400.0)
RBC: 5.48 Mil/uL (ref 4.22–5.81)
RDW: 13.9 % (ref 11.5–15.5)
WBC: 11.4 10*3/uL — ABNORMAL HIGH (ref 4.0–10.5)

## 2023-07-17 LAB — BASIC METABOLIC PANEL
BUN: 15 mg/dL (ref 6–23)
CO2: 29 meq/L (ref 19–32)
Calcium: 9.8 mg/dL (ref 8.4–10.5)
Chloride: 105 meq/L (ref 96–112)
Creatinine, Ser: 1.03 mg/dL (ref 0.40–1.50)
GFR: 79.58 mL/min (ref >60.00)
Glucose, Bld: 116 mg/dL — ABNORMAL HIGH (ref 70–99)
Potassium: 4.3 meq/L (ref 3.5–5.1)
Sodium: 140 meq/L (ref 135–145)

## 2023-07-17 LAB — TROPONIN I (HIGH SENSITIVITY): High Sens Troponin I: 7 ng/L (ref 2–17)

## 2023-07-17 LAB — LIPID PANEL
Cholesterol: 140 mg/dL (ref 0–200)
HDL: 46.9 mg/dL (ref >39.00)
LDL Cholesterol: 69 mg/dL (ref 0–99)
NonHDL: 93.09
Total CHOL/HDL Ratio: 3
Triglycerides: 118 mg/dL (ref 0.0–149.0)
VLDL: 23.6 mg/dL (ref 0.0–40.0)

## 2023-07-17 LAB — BRAIN NATRIURETIC PEPTIDE: Pro B Natriuretic peptide (BNP): 12 pg/mL (ref 0.0–100.0)

## 2023-07-17 LAB — CK: Total CK: 152 U/L (ref 7–232)

## 2023-07-17 LAB — CORTISOL: Cortisol, Plasma: 4.3 ug/dL

## 2023-07-17 NOTE — Patient Instructions (Signed)

## 2023-07-17 NOTE — Progress Notes (Signed)
Subjective:  Patient ID: Jeffery Murray, male    DOB: Aug 11, 1966  Age: 57 y.o. MRN: [id number]  CC: Hyperlipidemia   HPI Jeffery Murray presents for f/up ---  Discussed the use of AI scribe software for clinical note transcription with the patient, who gave verbal consent to proceed.  History of Present Illness   The patient, with a history of prostate issues, was scheduled for a prostate test or procedure, the specifics of which were unclear to them. They had previously undergone an MRI which showed no concerning findings, and a biopsy was deemed unnecessary due to low prostate levels. They were prescribed Finasteride, Tamsulosin, and Oxybutynin for management of their prostate condition.  The patient reported experiencing urinary urgency, particularly upon waking, necessitating frequent trips to the bathroom. They also reported occasional pain, which they managed by relaxing and drinking cold water. This pain was not associated with exercise but was occasionally accompanied by shortness of breath during activities. They denied experiencing chest pain during exercise or coughing up blood or phlegm.  The patient also reported occasional joint aches, particularly in the lower back and shoulder, but denied muscle pain or weakness. They have a smoking history but do not consume alcohol.       Outpatient Medications Prior to Visit  Medication Sig Dispense Refill   acetaminophen (TYLENOL) 500 MG tablet Take 500 mg by mouth every 6 (six) hours as needed.     dutasteride (AVODART) 0.5 MG capsule Take 1 capsule (0.5 mg total) by mouth daily. 90 capsule 1   rosuvastatin (CRESTOR) 10 MG tablet TAKE 1 TABLET BY MOUTH EVERY DAY 90 tablet 0   tamsulosin (FLOMAX) 0.4 MG CAPS capsule TAKE 2 CAPSULES(0.8 MG) BY MOUTH DAILY AFTER SUPPER 180 capsule 0   No facility-administered medications prior to visit.    ROS Review of Systems  Constitutional:  Negative for appetite change, chills, diaphoresis,  fatigue and fever.  HENT: Negative.    Respiratory:  Positive for cough and shortness of breath. Negative for chest tightness and wheezing.   Cardiovascular:  Negative for chest pain, palpitations and leg swelling.  Gastrointestinal:  Negative for abdominal pain, constipation, diarrhea, nausea and vomiting.  Genitourinary:  Negative for difficulty urinating.  Musculoskeletal: Negative.  Negative for arthralgias, joint swelling and myalgias.  Skin: Negative.  Negative for color change.  Neurological:  Negative for dizziness, weakness and light-headedness.  Hematological:  Negative for adenopathy. Does not bruise/bleed easily.  Psychiatric/Behavioral: Negative.      Objective:  BP 112/66 (BP Location: Left Arm, Patient Position: Sitting, Cuff Size: Large)   Pulse 90   Temp 98.1 F (36.7 C) (Oral)   Resp 16   Ht 5\' 7"  (1.702 m)   Wt 165 lb (74.8 kg)   SpO2 96%   BMI 25.84 kg/m   BP Readings from Last 3 Encounters:  07/17/23 112/66  10/04/22 120/70  04/26/21 131/82    Wt Readings from Last 3 Encounters:  07/17/23 165 lb (74.8 kg)  10/04/22 160 lb (72.6 kg)  04/26/21 164 lb (74.4 kg)    Physical Exam Vitals reviewed.  Constitutional:      Appearance: Normal appearance.  HENT:     Nose: Nose normal.     Mouth/Throat:     Mouth: Mucous membranes are moist.  Eyes:     General: No scleral icterus.    Conjunctiva/sclera: Conjunctivae normal.  Cardiovascular:     Rate and Rhythm: Normal rate and regular rhythm.     Heart sounds:  Normal heart sounds, S1 normal and S2 normal. No murmur heard.    No gallop.     Comments: EKG-- NSR, 88 bpm ?LAE No LVH, Q waves, or ST/T wave changes Pulmonary:     Effort: Pulmonary effort is normal.     Breath sounds: No stridor. No wheezing, rhonchi or rales.  Abdominal:     General: Abdomen is flat.     Palpations: There is no mass.     Tenderness: There is no abdominal tenderness. There is no guarding.     Hernia: No hernia is  present.  Musculoskeletal:     Cervical back: Neck supple.     Right lower leg: No edema.     Left lower leg: No edema.  Lymphadenopathy:     Cervical: No cervical adenopathy.  Skin:    General: Skin is warm and dry.  Neurological:     General: No focal deficit present.     Mental Status: He is alert. Mental status is at baseline.  Psychiatric:        Mood and Affect: Mood normal.        Behavior: Behavior normal.     Lab Results  Component Value Date   WBC 11.4 (H) 07/17/2023   HGB 16.3 07/17/2023   HCT 47.0 07/17/2023   PLT 281.0 07/17/2023   GLUCOSE 116 (H) 07/17/2023   CHOL 140 07/17/2023   TRIG 118.0 07/17/2023   HDL 46.90 07/17/2023   LDLCALC 69 07/17/2023   ALT 18 10/04/2022   AST 21 10/04/2022   NA 140 07/17/2023   K 4.3 07/17/2023   CL 105 07/17/2023   CREATININE 1.03 07/17/2023   BUN 15 07/17/2023   CO2 29 07/17/2023   TSH 2.18 10/04/2022   PSA 10.0 04/06/2023   HGBA1C 6.0 07/18/2023    MR PROSTATE W WO CONTRAST  Result Date: 05/25/2023 CLINICAL DATA:  Elevated PSA level of 15.07. Enlarged prostate gland. Biopsy 02/11/2021 was benign. EXAM: MR PROSTATE WITHOUT AND WITH CONTRAST TECHNIQUE: Multiplanar multisequence MRI images were obtained of the pelvis centered about the prostate. Pre and post contrast images were obtained. CONTRAST:  7.5 cc [employer] COMPARISON:  None Available. FINDINGS: Prostate: Encapsulated nodularity in the transition zone compatible with benign prostatic hypertrophy. Multilobulated prominent median lobe substantially indents the bladder base. No focal lesion of intermediate or higher suspicion for prostate cancer is identified. A Volume: Ellipsoid volume calculation: The prostate gland measures 6.8 by 5.0 by 6.9 cm (volume = 120 cm^3). Transcapsular spread:  Absent Seminal vesicle involvement: Absent Neurovascular bundle involvement: Absent Pelvic adenopathy: Absent Bone metastasis: Absent Other findings: No other significant findings.  IMPRESSION: 1. No focal lesion of intermediate or higher suspicion for prostate cancer is identified. 2. Benign prostatic hypertrophy and prostatomegaly. Prominent multilobulated median lobe indents the bladder base. Electronically Signed   By: Gaylyn Rong M.D.   On: 05/25/2023 07:41    Assessment & Plan:   Prediabetes -     Basic metabolic panel; Future -     Hemoglobin A1c; Future  Hyperlipidemia with target LDL less than 130 - LDL goal achieved. Doing well on the statin  -     Lipid panel; Future -     CK; Future  Tobacco abuse -     Ambulatory Referral for Lung Cancer Scre  DOE (dyspnea on exertion)- EKG and labs are reassuring. This is c/w COPD. -     CBC with Differential/Platelet; Future -     Troponin I (High  Sensitivity); Future -     Brain natriuretic peptide; Future -     EKG 12-Lead  Other specified hypotension - Labs are negative for secondary causes. -     Cortisol; Future  Flu vaccine need -     Flu vaccine trivalent PF, 6mos and older(Flulaval,Afluria,Fluarix,Fluzone)  Other orders -     Varicella-zoster vaccine IM     Follow-up: Return in about 3 months (around 10/17/2023).  Sanda Linger, MD

## 2023-07-18 ENCOUNTER — Ambulatory Visit (INDEPENDENT_AMBULATORY_CARE_PROVIDER_SITE_OTHER): Payer: 59

## 2023-07-18 DIAGNOSIS — R7303 Prediabetes: Secondary | ICD-10-CM | POA: Diagnosis not present

## 2023-07-18 LAB — HEMOGLOBIN A1C: Hgb A1c MFr Bld: 6 % (ref 4.6–6.5)

## 2023-08-02 ENCOUNTER — Other Ambulatory Visit: Payer: Self-pay | Admitting: Internal Medicine

## 2023-08-02 DIAGNOSIS — E785 Hyperlipidemia, unspecified: Secondary | ICD-10-CM

## 2023-08-03 ENCOUNTER — Emergency Department (HOSPITAL_COMMUNITY)
Admission: EM | Admit: 2023-08-03 | Discharge: 2023-08-03 | Disposition: A | Discharge status: Home / Self Care | Payer: 59

## 2023-08-03 ENCOUNTER — Encounter (HOSPITAL_COMMUNITY): Payer: Self-pay

## 2023-08-03 ENCOUNTER — Emergency Department (HOSPITAL_COMMUNITY): Payer: 59

## 2023-08-03 ENCOUNTER — Other Ambulatory Visit: Payer: Self-pay

## 2023-08-03 DIAGNOSIS — R079 Chest pain, unspecified: Secondary | ICD-10-CM | POA: Diagnosis present

## 2023-08-03 DIAGNOSIS — Z5329 Procedure and treatment not carried out because of patient's decision for other reasons: Secondary | ICD-10-CM | POA: Insufficient documentation

## 2023-08-03 DIAGNOSIS — D72829 Elevated white blood cell count, unspecified: Secondary | ICD-10-CM | POA: Diagnosis not present

## 2023-08-03 DIAGNOSIS — Z720 Tobacco use: Secondary | ICD-10-CM | POA: Insufficient documentation

## 2023-08-03 LAB — CBC
HCT: 43.1 % (ref 39.0–52.0)
Hemoglobin: 15.8 g/dL (ref 13.0–17.0)
MCH: 30.6 pg (ref 26.0–34.0)
MCHC: 36.7 g/dL — ABNORMAL HIGH (ref 30.0–36.0)
MCV: 83.4 fL (ref 80.0–100.0)
Platelets: 265 10*3/uL (ref 150–400)
RBC: 5.17 MIL/uL (ref 4.22–5.81)
RDW: 13 % (ref 11.5–15.5)
WBC: 10.8 10*3/uL — ABNORMAL HIGH (ref 4.0–10.5)
nRBC: 0 % (ref 0.0–0.2)

## 2023-08-03 LAB — BASIC METABOLIC PANEL
Anion gap: 7 (ref 5–15)
BUN: 16 mg/dL (ref 6–20)
CO2: 25 mmol/L (ref 22–32)
Calcium: 9.3 mg/dL (ref 8.9–10.3)
Chloride: 108 mmol/L (ref 98–111)
Creatinine, Ser: 0.98 mg/dL (ref 0.61–1.24)
GFR, Estimated: >60 mL/min (ref >60)
Glucose, Bld: 92 mg/dL (ref 70–99)
Potassium: 3.8 mmol/L (ref 3.5–5.1)
Sodium: 140 mmol/L (ref 135–145)

## 2023-08-03 LAB — D-DIMER, QUANTITATIVE: D-Dimer, Quant: 0.4 ug{FEU}/mL (ref 0.00–0.50)

## 2023-08-03 LAB — TROPONIN I (HIGH SENSITIVITY): Troponin I (High Sensitivity): 9 ng/L (ref <18)

## 2023-08-03 NOTE — ED Triage Notes (Signed)
Patient reports left sided chest pain and SOB since this AM. Reports he had a similar episode last week, but it resolved on its own. Denies nausea and vomiting. VSS, NAD

## 2023-08-03 NOTE — Discharge Instructions (Signed)
Please follow-up with your primary doctor as soon as possible.  We are also referring you to cardiology for further cardiac evaluation of your symptoms.  As discussed, you may follow-up your second troponin value on MyChart, and I would strongly urge you to return if it is significantly elevated.  Please return immediately if develop fevers, chills, worsening chest pain, worsening shortness of breath, lightheadedness, passout, feel your heart is racing or any new or worsening symptoms that are concerning to you.

## 2023-08-03 NOTE — ED Notes (Signed)
Pt reporting nurse he is about to leave and will take out IV. Pt nurse removed IV. MD aware

## 2023-08-03 NOTE — ED Provider Notes (Signed)
[place] EMERGENCY DEPARTMENT AT [facility] Provider Note   CSN: [id number] Arrival date & time: 08/03/23  1511     History  Chief Complaint  Patient presents with   Chest Pain    Jeffery Murray is a 57 y.o. male.  57 year old male presenting emergency department with left-sided chest pain.  Symptoms started at 9 AM this morning when he was getting ready for work.  Has been present since that time.  Constant, but worsens with inspiration and movement.  Some shortness of breath on exertion.  Does drive semitruck's for work.  Had a similar episode of chest pain shortness of breath a week ago that lasted an hour or 2, however it has persisted this time.  Denies any cardiac history, or pulmonary history.  Never had stress test or provocative cardiac testing per his report.   Chest Pain      Home Medications Prior to Admission medications   Medication Sig Start Date End Date Taking? Authorizing Provider  acetaminophen (TYLENOL) 500 MG tablet Take 500 mg by mouth every 6 (six) hours as needed.    [provider]  dutasteride (AVODART) 0.5 MG capsule Take 1 capsule (0.5 mg total) by mouth daily. 12/15/20   Etta Grandchild, MD  rosuvastatin (CRESTOR) 10 MG tablet TAKE 1 TABLET BY MOUTH EVERY DAY 08/02/23   Etta Grandchild, MD  tamsulosin (FLOMAX) 0.4 MG CAPS capsule TAKE 2 CAPSULES(0.8 MG) BY MOUTH DAILY AFTER SUPPER 01/09/23   Etta Grandchild, MD      Allergies    Metformin and related and Other    Review of Systems   Review of Systems  Cardiovascular:  Positive for chest pain.    Physical Exam Updated Vital Signs BP (!) 124/90   Pulse 79   Temp 98.7 F (37.1 C) (Oral)   Resp 18   Ht 5\' 7"  (1.702 m)   Wt 72.6 kg   SpO2 97%   BMI 25.06 kg/m  Physical Exam Vitals and nursing note reviewed.  HENT:     Head: Normocephalic.  Cardiovascular:     Rate and Rhythm: Normal rate.     Heart sounds: Normal heart sounds.  Pulmonary:     Effort: Pulmonary  effort is normal.     Breath sounds: Normal breath sounds.  Musculoskeletal:     Cervical back: Normal range of motion.     Right lower leg: No edema.     Left lower leg: No edema.  Skin:    General: Skin is warm and dry.     Capillary Refill: Capillary refill takes less than 2 seconds.  Neurological:     General: No focal deficit present.     Mental Status: He is alert.  Psychiatric:        Mood and Affect: Mood normal.        Behavior: Behavior normal.     ED Results / Procedures / Treatments   Labs (all labs ordered are listed, but only abnormal results are displayed) Labs Reviewed  CBC - Abnormal; Notable for the following components:      Result Value   WBC 10.8 (*)    MCHC 36.7 (*)    All other components within normal limits  BASIC METABOLIC PANEL  D-DIMER, QUANTITATIVE  TROPONIN I (HIGH SENSITIVITY)  TROPONIN I (HIGH SENSITIVITY)    EKG EKG Interpretation Date/Time:  Friday August 03 2023 15:18:55 EDT Ventricular Rate:  95 PR Interval:  135 QRS Duration:  83  QT Interval:  351 QTC Calculation: 442 R Axis:   76  Text Interpretation: Sinus rhythm Biatrial enlargement Confirmed by Estanislado Pandy [phone]) on 08/03/2023 3:56:05 PM  Radiology DG Chest 2 View  Result Date: 08/03/2023 CLINICAL DATA:  Left-sided chest pain.  Shortness of breath. EXAM: CHEST - 2 VIEW COMPARISON:  None Available. FINDINGS: Bilateral lung fields are clear. Bilateral costophrenic angles are clear. Normal cardio-mediastinal silhouette. No acute osseous abnormalities. The soft tissues are within normal limits. IMPRESSION: *No active cardiopulmonary disease. Electronically Signed   By: Jules Schick M.D.   On: 08/03/2023 16:51    Procedures Procedures    Medications Ordered in ED Medications - No data to display  ED Course/ Medical Decision Making/ A&P Clinical Course as of 08/03/23 2303  Fri Aug 03, 2023  1556 ED EKG Noted and reviewed; no STEMI.  Some LVH pattern.  But otherwise  sinus rhythm [TY]  1556 CBC(!) Minor leukocytosis; appears to be downtrending from several weeks ago. [TY]  1558 From chart review had visit on 07/17/2023 with PCP history of prediabetes, hyperlipidemia, tobacco use, dyspnea on exertion [TY]  1705 Troponin I (High Sensitivity): 9 Reassuring. [TY]  1900 D-Dimer, Quant: 0.40 PE less likely given low risk and negative d-dimer.  [TY]  2012 Informed by nurse that patient wants to leave.  We do not have second troponin back yet.  Discussed my concern with patient that he could be having NSTEMI and that his symptoms may be due to a cardiac etiology.  Patient has capacity and insight.  Discussed at length the risks of leaving to include permanent disability or even death.  Patient voiced understanding and states he would like to leave.  He will follow-up results on MyChart and return if elevated and follow-up with his PCP.  Patient will be leaving AGAINST MEDICAL ADVICE at this time. [TY]    Clinical Course User Index [TY] Coral Spikes, DO                                 Medical Decision Making Well-appearing 57 year old male to the emergency department left-sided chest pain.  Is afebrile nontachycardic maintaining oxygen saturation on room air in the upper 90s.  Does have some reproducibility with palpation and left upper extremity movement.  EKG with LVH, but no ST segment changes to indicate ischemia on my independent interpretation.  Troponin pending.  Moderate heart score.  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  He is nontachycardic hypoxic.  Otherwise low risk, will get D-dimer.  He does note that he has had some rhinorrhea and congestion over the past week or so as well.  Question pleuritis.  See ED course for further MDM and final disposition.  Amount and/or Complexity of Data Reviewed Labs: ordered. Decision-making details documented in ED  Course. Radiology: ordered. ECG/medicine tests:  Decision-making details documented in ED Course.          Final Clinical Impression(s) / ED Diagnoses Final diagnoses:  Chest pain, unspecified type    Rx / DC Orders ED Discharge Orders          Ordered    Ambulatory referral to Cardiology       Comments: If you have not heard from the Cardiology office within the next 72 hours please call [phone].   08/03/23 2014  Coral Spikes, DO 08/03/23 2303

## 2023-10-29 ENCOUNTER — Other Ambulatory Visit: Payer: Self-pay | Admitting: Internal Medicine

## 2023-10-29 DIAGNOSIS — E785 Hyperlipidemia, unspecified: Secondary | ICD-10-CM

## 2023-11-12 ENCOUNTER — Telehealth: Payer: Self-pay

## 2023-11-12 NOTE — Transitions of Care (Post Inpatient/ED Visit) (Unsigned)
   11/12/2023  Name: Jeffery Murray MRN: [id number] DOB: 1966-04-25  Today's TOC FU Call Status: Today's TOC FU Call Status:: Unsuccessful Call (1st Attempt) Unsuccessful Call (1st Attempt) Date: 11/12/23  Attempted to reach the patient regarding the most recent Inpatient/ED visit.  Follow Up Plan: Additional outreach attempts will be made to reach the patient to complete the Transitions of Care (Post Inpatient/ED visit) call.   Signature Karena Addison, LPN [facility] Nurse Health Advisor Direct Dial [phone]

## 2023-11-13 NOTE — Transitions of Care (Post Inpatient/ED Visit) (Signed)
   11/13/2023  Name: Jeffery Murray MRN: [id number] DOB: [birth year] Patient states he has not been in the hospital Today's TOC FU Call Status: Today's TOC FU Call Status:: Successful TOC FU Call Completed Unsuccessful Call (1st Attempt) Date: 11/12/23 [facility] FU Call Complete Date: 11/13/23  Attempted to reach the patient regarding the most recent Inpatient/ED visit.  Follow Up Plan: No further outreach attempts will be made at this time. We have been unable to contact the patient.  Signature Julian Lemmings, LPN [facility] Nurse Health Advisor Direct Dial [phone]

## 2024-01-29 ENCOUNTER — Encounter: Payer: Self-pay | Admitting: *Deleted

## 2024-10-09 DEATH — deceased
